# Patient Record
Sex: Female | Born: 1960 | Race: White | Hispanic: No | Marital: Married | Smoking: Former smoker
Health system: Southern US, Community
[De-identification: ages and names within clinical notes are randomized; demographics above are authoritative.]

## PROBLEM LIST (undated history)

## (undated) DIAGNOSIS — J45909 Unspecified asthma, uncomplicated: Secondary | ICD-10-CM

## (undated) DIAGNOSIS — R319 Hematuria, unspecified: Secondary | ICD-10-CM

## (undated) DIAGNOSIS — N816 Rectocele: Secondary | ICD-10-CM

## (undated) DIAGNOSIS — N951 Menopausal and female climacteric states: Secondary | ICD-10-CM

## (undated) DIAGNOSIS — J302 Other seasonal allergic rhinitis: Secondary | ICD-10-CM

## (undated) HISTORY — DX: Hematuria, unspecified: R31.9

## (undated) HISTORY — PX: OTHER SURGICAL HISTORY: SHX169

## (undated) HISTORY — DX: Menopausal and female climacteric states: N95.1

## (undated) HISTORY — DX: Rectocele: N81.6

---

## 2000-09-30 ENCOUNTER — Other Ambulatory Visit: Admission: RE | Admit: 2000-09-30 | Discharge: 2000-09-30 | Payer: Self-pay | Admitting: Obstetrics and Gynecology

## 2005-05-24 ENCOUNTER — Ambulatory Visit (HOSPITAL_COMMUNITY): Admission: RE | Admit: 2005-05-24 | Discharge: 2005-05-24 | Payer: Self-pay | Admitting: Obstetrics and Gynecology

## 2006-06-14 ENCOUNTER — Ambulatory Visit (HOSPITAL_COMMUNITY): Admission: RE | Admit: 2006-06-14 | Discharge: 2006-06-14 | Payer: Self-pay | Admitting: Obstetrics and Gynecology

## 2007-05-01 ENCOUNTER — Other Ambulatory Visit: Admission: RE | Admit: 2007-05-01 | Discharge: 2007-05-01 | Payer: Self-pay | Admitting: Obstetrics and Gynecology

## 2007-08-04 ENCOUNTER — Ambulatory Visit (HOSPITAL_COMMUNITY): Admission: RE | Admit: 2007-08-04 | Discharge: 2007-08-04 | Payer: Self-pay | Admitting: Obstetrics and Gynecology

## 2007-09-18 ENCOUNTER — Ambulatory Visit (HOSPITAL_COMMUNITY): Admission: RE | Admit: 2007-09-18 | Discharge: 2007-09-18 | Payer: Self-pay | Admitting: Obstetrics and Gynecology

## 2008-07-09 ENCOUNTER — Other Ambulatory Visit: Admission: RE | Admit: 2008-07-09 | Discharge: 2008-07-09 | Payer: Self-pay | Admitting: Obstetrics and Gynecology

## 2008-10-22 ENCOUNTER — Ambulatory Visit (HOSPITAL_COMMUNITY): Admission: RE | Admit: 2008-10-22 | Discharge: 2008-10-22 | Payer: Self-pay | Admitting: Obstetrics and Gynecology

## 2009-08-06 ENCOUNTER — Other Ambulatory Visit: Admission: RE | Admit: 2009-08-06 | Discharge: 2009-08-06 | Payer: Self-pay | Admitting: Obstetrics and Gynecology

## 2009-10-24 ENCOUNTER — Ambulatory Visit (HOSPITAL_COMMUNITY): Admission: RE | Admit: 2009-10-24 | Discharge: 2009-10-24 | Payer: Self-pay | Admitting: Obstetrics and Gynecology

## 2010-04-12 ENCOUNTER — Encounter: Payer: Self-pay | Admitting: Obstetrics and Gynecology

## 2010-11-06 ENCOUNTER — Other Ambulatory Visit: Payer: Self-pay | Admitting: Obstetrics and Gynecology

## 2010-11-06 DIAGNOSIS — N6310 Unspecified lump in the right breast, unspecified quadrant: Secondary | ICD-10-CM

## 2010-11-11 ENCOUNTER — Other Ambulatory Visit: Payer: Self-pay | Admitting: Obstetrics and Gynecology

## 2010-11-11 ENCOUNTER — Ambulatory Visit (HOSPITAL_COMMUNITY)
Admission: RE | Admit: 2010-11-11 | Discharge: 2010-11-11 | Disposition: A | Discharge status: Home / Self Care | Payer: BC Managed Care – PPO | Source: Ambulatory Visit | Attending: Obstetrics and Gynecology | Admitting: Obstetrics and Gynecology

## 2010-11-11 DIAGNOSIS — N6310 Unspecified lump in the right breast, unspecified quadrant: Secondary | ICD-10-CM

## 2010-11-11 DIAGNOSIS — N6009 Solitary cyst of unspecified breast: Secondary | ICD-10-CM | POA: Insufficient documentation

## 2011-09-27 ENCOUNTER — Other Ambulatory Visit (HOSPITAL_COMMUNITY)
Admission: RE | Admit: 2011-09-27 | Discharge: 2011-09-27 | Disposition: A | Discharge status: Home / Self Care | Payer: BC Managed Care – PPO | Source: Ambulatory Visit | Attending: Obstetrics and Gynecology | Admitting: Obstetrics and Gynecology

## 2011-09-27 DIAGNOSIS — Z1159 Encounter for screening for other viral diseases: Secondary | ICD-10-CM | POA: Insufficient documentation

## 2011-09-27 DIAGNOSIS — Z01419 Encounter for gynecological examination (general) (routine) without abnormal findings: Secondary | ICD-10-CM | POA: Insufficient documentation

## 2011-09-28 ENCOUNTER — Telehealth: Payer: Self-pay

## 2011-09-28 ENCOUNTER — Other Ambulatory Visit: Payer: Self-pay

## 2011-09-28 DIAGNOSIS — Z139 Encounter for screening, unspecified: Secondary | ICD-10-CM

## 2011-09-28 NOTE — Telephone Encounter (Signed)
MOVI PREP SPLIT DOSING, REGULAR BREAKFAST. CLEAR LIQUIDS AFTER 9 AM.  

## 2011-09-28 NOTE — Telephone Encounter (Signed)
Gastroenterology Pre-Procedure Form    Request Date: 09/28/2011         Requesting Physician: Cyril Mourning at [facility]     PATIENT INFORMATION:  Gabrielle Ruiz is a 51 y.o., female (DOB=01/03/1961).  PROCEDURE: Procedure(s) requested: colonoscopy Procedure Reason: screening for colon cancer  PATIENT REVIEW QUESTIONS: The patient reports the following:   1. Diabetes Melitis: no 2. Joint replacements in the past 12 months: no 3. Major health problems in the past 3 months: no 4. Has an artificial valve or MVP:no 5. Has been advised in past to take antibiotics in advance of a procedure like teeth cleaning: no}    MEDICATIONS & ALLERGIES:    Patient reports the following regarding taking any blood thinners:   Plavix? no Aspirin?no Coumadin?  no  Patient confirms/reports the following medications:  Current Outpatient Prescriptions  Medication Sig Dispense Refill  . fish oil-omega-3 fatty acids 1000 MG capsule Take 1 g by mouth daily.      . fluticasone (FLONASE) 50 MCG/ACT nasal spray Place 2 sprays into the nose daily.      Marland Kitchen glucosamine-chondroitin 500-400 MG tablet Take 1 tablet by mouth once.      . loratadine (CLARITIN) 10 MG tablet Take 10 mg by mouth daily.      . Multiple Vitamin (MULTIVITAMIN) tablet Take 1 tablet by mouth daily.      . NON FORMULARY Calcium 630 mg plus Vitamin D 500 mg    One tablet daily        Patient confirms/reports the following allergies:  Allergies  Allergen Reactions  . Erythromycin Rash  . Penicillins Rash    Patient is appropriate to schedule for requested procedure(s): yes  AUTHORIZATION INFORMATION Primary Insurance:   ID #:   Group #:  Pre-Cert / Auth required Pre-Cert / Auth #:   Secondary Insurance:   ID #:   Group #:  Pre-Cert / Auth required:  Pre-Cert / Auth #:   No orders of the defined types were placed in this encounter.    SCHEDULE INFORMATION: Procedure has been scheduled as follows:  Date: 10/19/2011       Time: 10:15 AM  Location: [facility] Short Stay  This Gastroenterology Pre-Precedure Form is being routed to the following provider(s) for review: Jonette Eva, MD

## 2011-09-29 NOTE — Telephone Encounter (Signed)
Rx and instructions mailed to pt.  

## 2011-10-04 ENCOUNTER — Encounter (HOSPITAL_COMMUNITY): Payer: Self-pay | Admitting: Pharmacy Technician

## 2011-10-15 ENCOUNTER — Telehealth: Payer: Self-pay

## 2011-10-15 NOTE — Telephone Encounter (Signed)
Pt's husband called and wanted a cheaper prep for pt. I faxed over [facility] and new instructions for pt to [facility].

## 2011-10-19 ENCOUNTER — Ambulatory Visit (HOSPITAL_COMMUNITY)
Admission: RE | Admit: 2011-10-19 | Discharge: 2011-10-19 | Disposition: A | Discharge status: Home / Self Care | Payer: BC Managed Care – PPO | Source: Ambulatory Visit | Attending: Gastroenterology | Admitting: Gastroenterology

## 2011-10-19 ENCOUNTER — Encounter (HOSPITAL_COMMUNITY): Payer: Self-pay | Admitting: *Deleted

## 2011-10-19 ENCOUNTER — Encounter (HOSPITAL_COMMUNITY)
Admission: RE | Disposition: A | Discharge status: Home / Self Care | Payer: Self-pay | Source: Ambulatory Visit | Attending: Gastroenterology

## 2011-10-19 DIAGNOSIS — Z139 Encounter for screening, unspecified: Secondary | ICD-10-CM

## 2011-10-19 DIAGNOSIS — D126 Benign neoplasm of colon, unspecified: Secondary | ICD-10-CM | POA: Insufficient documentation

## 2011-10-19 DIAGNOSIS — Z1211 Encounter for screening for malignant neoplasm of colon: Secondary | ICD-10-CM | POA: Insufficient documentation

## 2011-10-19 DIAGNOSIS — K648 Other hemorrhoids: Secondary | ICD-10-CM

## 2011-10-19 HISTORY — PX: COLONOSCOPY: SHX5424

## 2011-10-19 HISTORY — DX: Other seasonal allergic rhinitis: J30.2

## 2011-10-19 HISTORY — DX: Unspecified asthma, uncomplicated: J45.909

## 2011-10-19 SURGERY — COLONOSCOPY
Anesthesia: Moderate Sedation

## 2011-10-19 MED ORDER — MEPERIDINE HCL 100 MG/ML IJ SOLN
INTRAMUSCULAR | Status: AC
  Filled 2011-10-19: qty 1

## 2011-10-19 MED ORDER — MIDAZOLAM HCL 5 MG/5ML IJ SOLN
INTRAMUSCULAR | Status: AC
  Filled 2011-10-19: qty 10

## 2011-10-19 MED ORDER — MIDAZOLAM HCL 5 MG/5ML IJ SOLN
INTRAMUSCULAR | Status: DC | PRN
  Administered 2011-10-19: 2 mg via INTRAVENOUS
  Administered 2011-10-19: 1 mg via INTRAVENOUS

## 2011-10-19 MED ORDER — HYDROCORTISONE ACE-PRAMOXINE 1-1 % RE CREA: TOPICAL_CREAM | RECTAL | Status: AC

## 2011-10-19 MED ORDER — MEPERIDINE HCL 100 MG/ML IJ SOLN
INTRAMUSCULAR | Status: DC | PRN
  Administered 2011-10-19: 50 mg via INTRAVENOUS
  Administered 2011-10-19: 25 mg via INTRAVENOUS

## 2011-10-19 MED ORDER — SODIUM CHLORIDE 0.45 % IV SOLN
Freq: Once | INTRAVENOUS | Status: AC
  Administered 2011-10-19: 10:00:00 via INTRAVENOUS

## 2011-10-19 MED ORDER — STERILE WATER FOR IRRIGATION IR SOLN
Status: DC | PRN
  Administered 2011-10-19: 10:00:00

## 2011-10-19 NOTE — Op Note (Signed)
[facility] [address] [place], [place]  [address]  COLONOSCOPY PROCEDURE REPORT  PATIENT:  Gabrielle Ruiz, Gabrielle Ruiz  MR#:  [id number] BIRTHDATE:  12-Mar-1961, 50 yrs. old  GENDER:  female  ENDOSCOPIST:  Jonette Eva, MD REF. BY:  Artis Delay, M.D. ASSISTANT:  PROCEDURE DATE:  10/19/2011 PROCEDURE:  Colonoscopy with biopsy  INDICATIONS:  Screening  MEDICATIONS:   Demerol 75 mg IV, Versed 3 mg IV  DESCRIPTION OF PROCEDURE:    Physical exam was performed. Informed consent was obtained from the patient after explaining the benefits, risks, and alternatives to procedure.  The patient was connected to monitor and placed in left lateral position. Continuous oxygen was provided by nasal cannula and IV medicine administered through an indwelling cannula.  After administration of sedation and rectal exam, the patient's rectum was intubated and the EC-[id number] ([id number]) colonoscope was advanced under direct visualization to the cecum.  The scope was removed slowly by carefully examining the color, texture, anatomy, and integrity mucosa on the way out.  The patient was recovered in endoscopy and discharged home in satisfactory condition. <<PROCEDUREIMAGES>>  FINDINGS:  There WAS ONE 3 MM SESSILE polyp identified and removed. in the proximal transverse colon VIA COLD FORCEPS. NO DIVERTICULA.  SMALL Internal Hemorrhoids were found.  PREP QUALITY: EXCELLENT CECAL W/D TIME:    7 minutes  COMPLICATIONS:    None  ENDOSCOPIC IMPRESSION: 1) Polyp in the proximal transverse colon 2) Internal hemorrhoids  RECOMMENDATIONS: 1) Await biopsy results HIGH FIBER DIET PROCOCREAM 2-4 TIMES A DAY FOR 10 DAYS TO RELIEVE HEMORRHOID BLEEDING/PAIN/PRESSURE TCS IN 10 YEARS WITH 1/2 MOVIPREP OR PREPOPIK  REPEAT EXAM:  No  ______________________________ Jonette Eva, MD  CC:  Artis Delay, M.D.  n. eSIGNED:   Myan Suit at 10/19/2011 10:31 AM  Glendell Docker, [phone]

## 2011-10-19 NOTE — H&P (Signed)
  Primary Care Physician:  Cassell Smiles., MD Primary Gastroenterologist:  Dr. Darrick Penna  Pre-Procedure History & Physical: HPI:  Gabrielle Ruiz is a 51 y.o. female here for COLON CANCER SCREENING.   Past Medical History  Diagnosis Date  . Seasonal allergies   . Asthma     Exercise and Allergy induced    Past Surgical History  Procedure Date  . Hymen ring surgically removed     Prior to Admission medications   Medication Sig Start Date End Date Taking? Authorizing Provider  aspirin 81 MG tablet Take 81 mg by mouth daily.   Yes Historical Provider, MD  Calcium Carbonate-Vit D-Min (CALCIUM 600+D PLUS MINERALS) 600-400 MG-UNIT TABS Take 1 tablet by mouth daily.   Yes Historical Provider, MD  fish oil-omega-3 fatty acids 1000 MG capsule Take 1 g by mouth daily.   Yes Historical Provider, MD  fluticasone (FLONASE) 50 MCG/ACT nasal spray Place 2 sprays into the nose daily.   Yes Historical Provider, MD  glucosamine-chondroitin 500-400 MG tablet Take 1 tablet by mouth daily.    Yes Historical Provider, MD  loratadine (CLARITIN) 10 MG tablet Take 10 mg by mouth daily.   Yes Historical Provider, MD  Multiple Vitamin (MULTIVITAMIN WITH MINERALS) TABS Take 1 tablet by mouth daily.   Yes Historical Provider, MD  psyllium (METAMUCIL) 58.6 % powder Take 1 packet by mouth daily.   Yes Historical Provider, MD  acetaminophen (TYLENOL) 500 MG tablet Take 500 mg by mouth every 6 (six) hours as needed. For pain    Historical Provider, MD  albuterol (PROVENTIL HFA;VENTOLIN HFA) 108 (90 BASE) MCG/ACT inhaler Inhale 2 puffs into the lungs every 6 (six) hours as needed. For shortness of breath    Historical Provider, MD    Allergies as of 09/28/2011 - never reviewed  Allergen Reaction Noted  . Erythromycin Rash 09/28/2011  . Penicillins Rash 09/28/2011    Family History  Problem Relation Age of Onset  . Colon cancer Neg Hx     History   Social History  . Marital Status: Married   Spouse Name: N/A    Number of Children: N/A  . Years of Education: N/A   Occupational History  . Not on file.   Social History Main Topics  . Smoking status: Former Smoker -- 0.5 packs/day for 15 years    Types: Cigarettes  . Smokeless tobacco: Not on file  . Alcohol Use: 0.6 oz/week    1 Glasses of wine per week  . Drug Use: No  . Sexually Active:    Other Topics Concern  . Not on file   Social History Narrative  . No narrative on file    Review of Systems: See HPI, otherwise negative ROS   Physical Exam: BP 136/81  Pulse 75  Temp 98.1 F (36.7 C) (Oral)  Resp 16  Ht 5\' 2"  (1.575 m)  Wt 140 lb (63.504 kg)  BMI 25.61 kg/m2  SpO2 98%  LMP 10/19/2011 General:   Alert,  pleasant and cooperative in NAD Head:  Normocephalic and atraumatic. Neck:  Supple;  Lungs:  Clear throughout to auscultation.    Heart:  Regular rate and rhythm. Abdomen:  Soft, nontender and nondistended. Normal bowel sounds, without guarding, and without rebound.   Neurologic:  Alert and  oriented x4;  grossly normal neurologically.  Impression/Plan:     SCREENING  Plan:  1. TCS TODAY

## 2011-10-21 ENCOUNTER — Encounter (HOSPITAL_COMMUNITY): Payer: Self-pay | Admitting: Gastroenterology

## 2011-10-22 ENCOUNTER — Telehealth: Payer: Self-pay | Admitting: Gastroenterology

## 2011-10-22 NOTE — Telephone Encounter (Signed)
Please call pt. She had a polypoid lesion, removed and it was benign. High fiber diet. TCS in 10 years.

## 2011-10-22 NOTE — Telephone Encounter (Signed)
Pt aware of results with no questions

## 2011-10-22 NOTE — Telephone Encounter (Signed)
Recall made 

## 2011-10-25 ENCOUNTER — Telehealth: Payer: Self-pay

## 2011-10-25 NOTE — Telephone Encounter (Signed)
Pt called. Said she has not had a BM since her colonoscopy on 10/19/2011. Said she is drinking lots of water, eating high fiber foods, and she would like to know what Dr. Darrick Penna recommends for her. Please advise!

## 2011-10-25 NOTE — Telephone Encounter (Signed)
Sent pager message to Dr. Darrick Penna at 2:50 PM. Routing to Gerrit Halls, NP for advice.

## 2011-10-25 NOTE — Telephone Encounter (Signed)
PLEASE CALL PT. SHE SHOULD HAVE A BM ANY DAY NOW. SHE HAD AN EXCELLENT PREP AND IT WILL TAKE A FEW DAYS TO GET STOOL TO REACH HER RECTUM FOR A BM. CONTINUE HIGH FIBER DIET. DRINK WATER.

## 2011-10-25 NOTE — Telephone Encounter (Signed)
LMOM to call.

## 2011-10-26 NOTE — Telephone Encounter (Signed)
Called and informed pt.  

## 2012-03-02 ENCOUNTER — Other Ambulatory Visit: Payer: Self-pay | Admitting: Obstetrics and Gynecology

## 2012-03-02 DIAGNOSIS — Z139 Encounter for screening, unspecified: Secondary | ICD-10-CM

## 2012-03-13 ENCOUNTER — Ambulatory Visit (HOSPITAL_COMMUNITY)
Admission: RE | Admit: 2012-03-13 | Discharge: 2012-03-13 | Disposition: A | Discharge status: Home / Self Care | Payer: BC Managed Care – PPO | Source: Ambulatory Visit | Attending: Obstetrics and Gynecology | Admitting: Obstetrics and Gynecology

## 2012-03-13 DIAGNOSIS — N63 Unspecified lump in unspecified breast: Secondary | ICD-10-CM | POA: Insufficient documentation

## 2012-03-13 DIAGNOSIS — Z139 Encounter for screening, unspecified: Secondary | ICD-10-CM

## 2012-03-13 DIAGNOSIS — Z1231 Encounter for screening mammogram for malignant neoplasm of breast: Secondary | ICD-10-CM | POA: Insufficient documentation

## 2012-03-21 ENCOUNTER — Other Ambulatory Visit: Payer: Self-pay | Admitting: Obstetrics and Gynecology

## 2012-03-21 DIAGNOSIS — R928 Other abnormal and inconclusive findings on diagnostic imaging of breast: Secondary | ICD-10-CM

## 2012-04-20 ENCOUNTER — Ambulatory Visit
Admission: RE | Admit: 2012-04-20 | Discharge: 2012-04-20 | Disposition: A | Discharge status: Home / Self Care | Payer: BC Managed Care – PPO | Source: Ambulatory Visit | Attending: Obstetrics and Gynecology | Admitting: Obstetrics and Gynecology

## 2012-04-20 DIAGNOSIS — R928 Other abnormal and inconclusive findings on diagnostic imaging of breast: Secondary | ICD-10-CM

## 2013-07-08 IMAGING — MG MM DIGITAL SCREENING BILAT
4 series · 4 of 4 positions shown · non-contrast
Comparison: Previous exams.

CLINICAL DATA: Screening.

DIGITAL BILATERAL SCREENING MAMMOGRAM WITH CAD

[L CC]
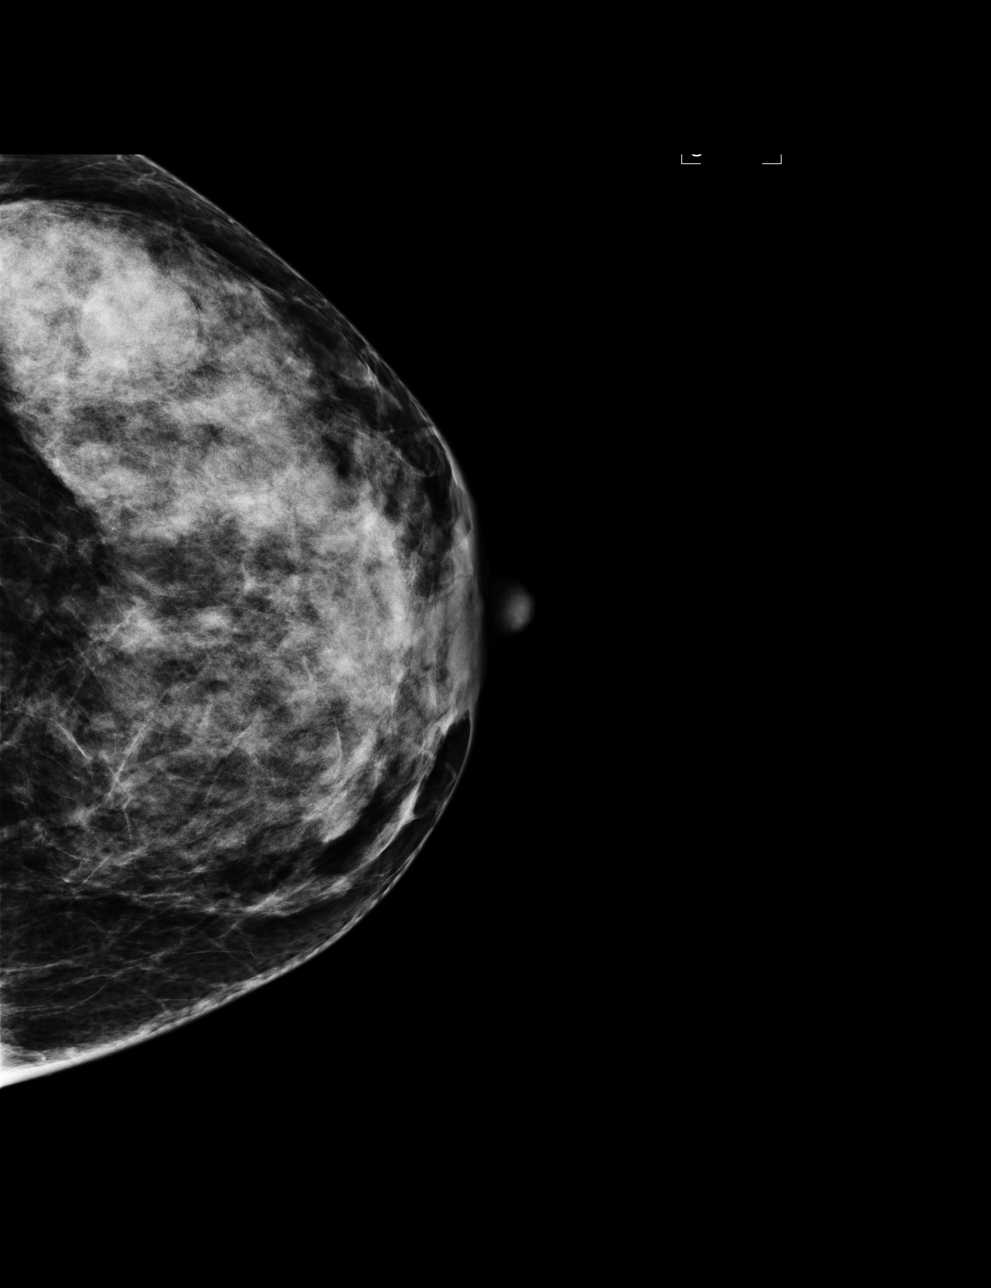

[L MLO]
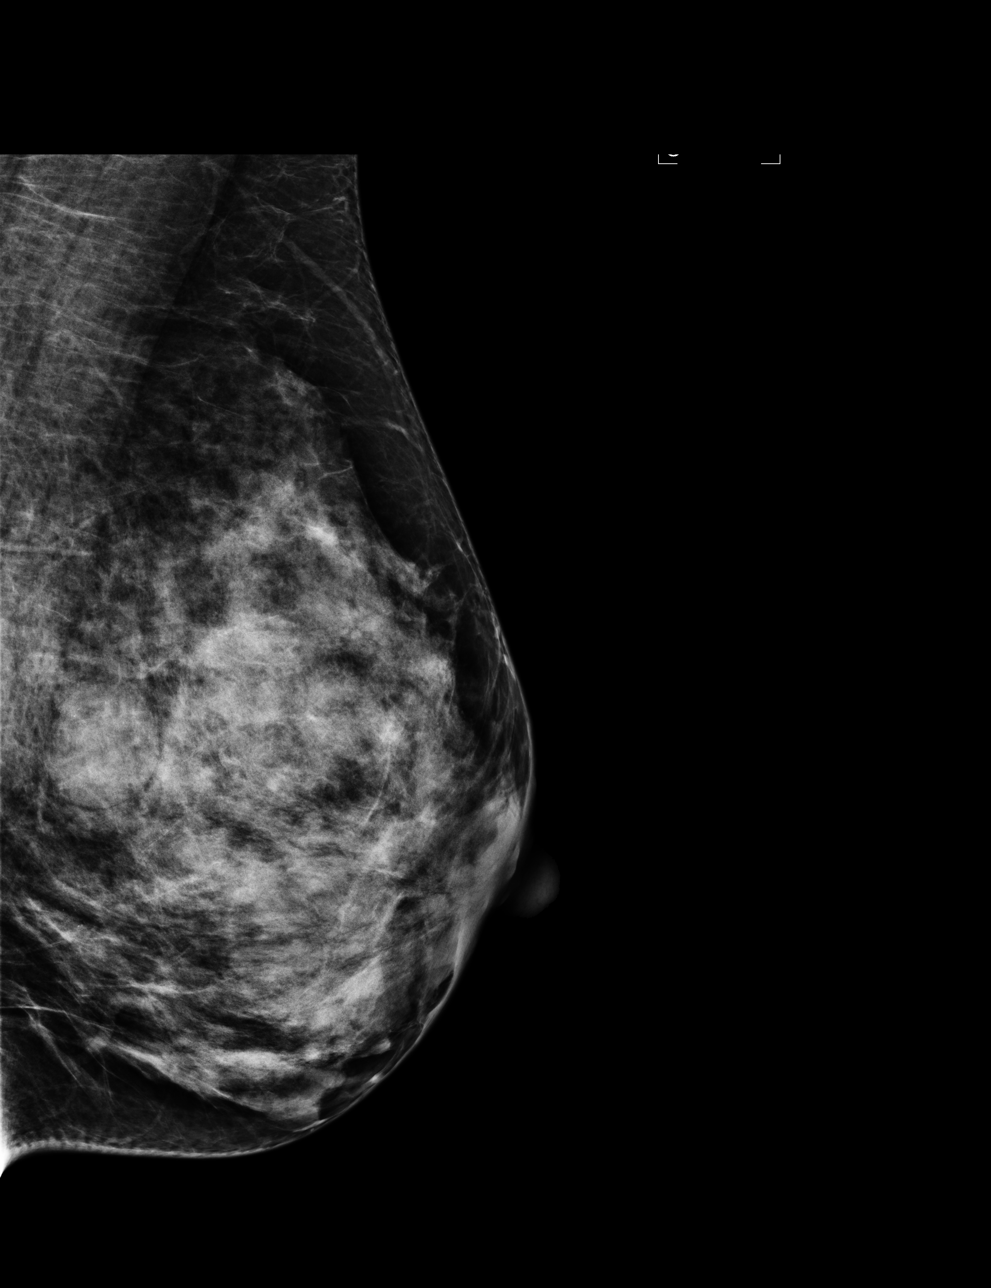

[R CC]
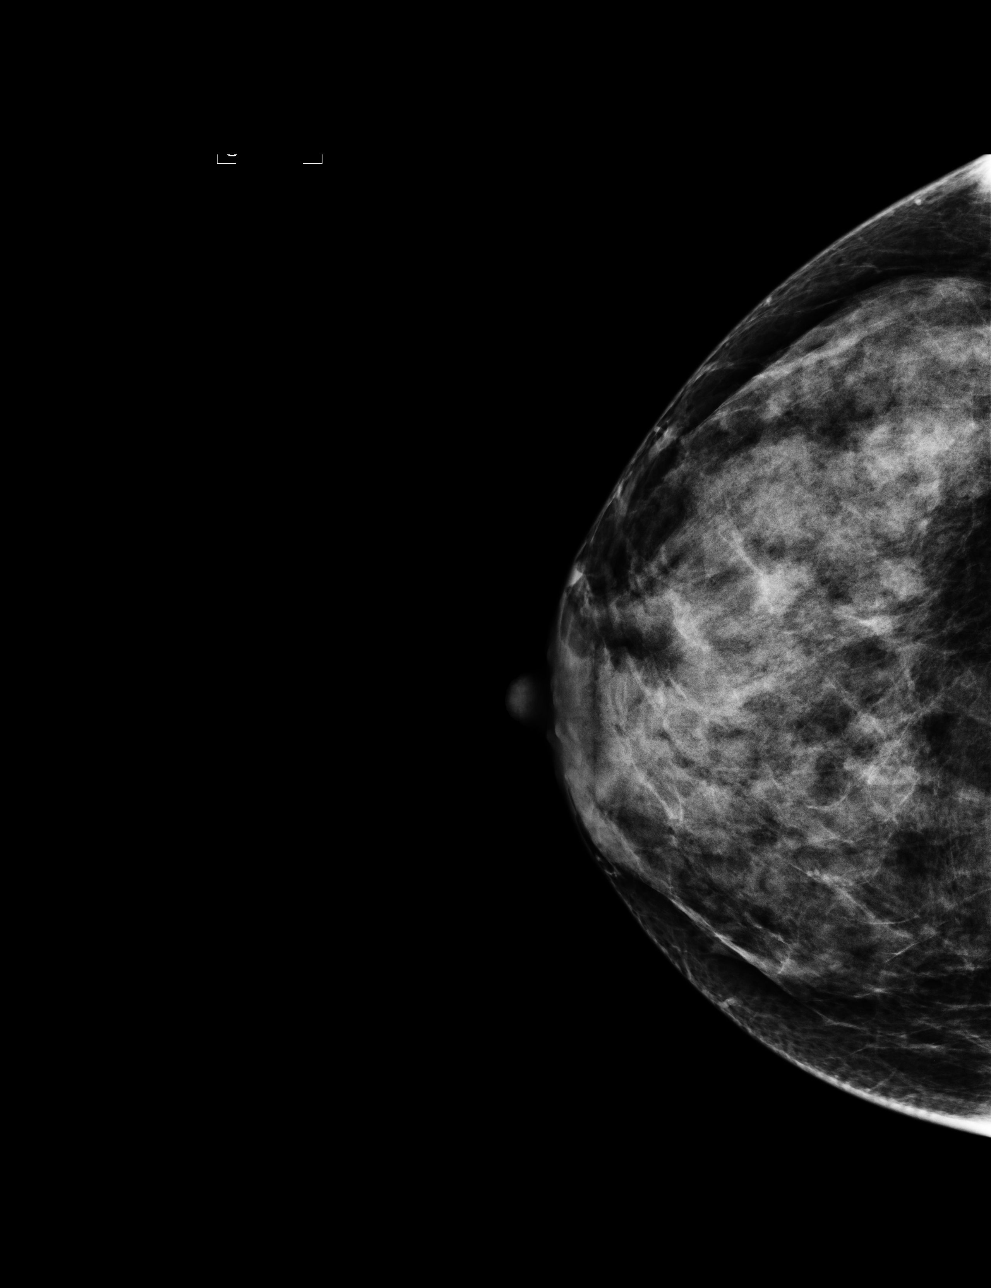

[R MLO]
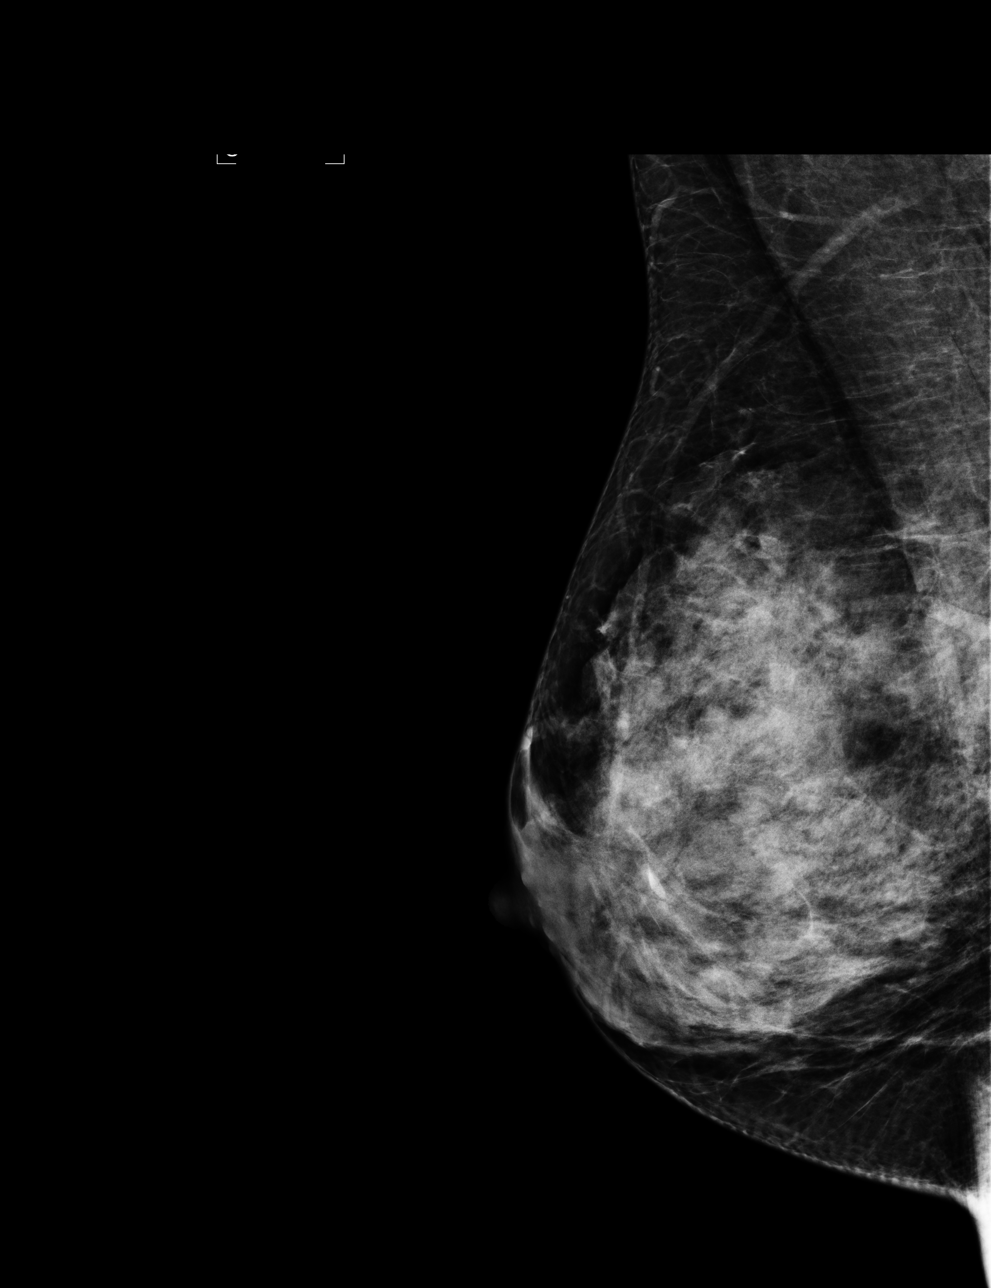

[4 of 4 positions shown; findings below may reference images not displayed]

FINDINGS: ACR Breast Density Category 4: The breast tissue is extremely
dense.

In the left breast, a possible mass warrants further evaluation
with spot compression views and possibly ultrasound.  In the right
breast, no masses or malignant type calcifications are identified.

Images were processed with CAD.
IMPRESSION: Further evaluation is suggested for possible mass in the left
breast.

RECOMMENDATION:
Diagnostic mammogram and possibly ultrasound of the left breast.
(Code:61-9-CCI)

BI-RADS CATEGORY 0:  Incomplete.  Need additional imaging
evaluation and/or prior mammograms for comparison.

## 2013-10-16 ENCOUNTER — Ambulatory Visit (INDEPENDENT_AMBULATORY_CARE_PROVIDER_SITE_OTHER): Payer: BC Managed Care – PPO | Admitting: Adult Health

## 2013-10-16 ENCOUNTER — Encounter: Payer: Self-pay | Admitting: Adult Health

## 2013-10-16 VITALS — BP 102/60 | HR 70 | Ht 62.0 in | Wt 146.5 lb

## 2013-10-16 DIAGNOSIS — Z01419 Encounter for gynecological examination (general) (routine) without abnormal findings: Secondary | ICD-10-CM

## 2013-10-16 DIAGNOSIS — N951 Menopausal and female climacteric states: Secondary | ICD-10-CM

## 2013-10-16 DIAGNOSIS — Z1212 Encounter for screening for malignant neoplasm of rectum: Secondary | ICD-10-CM

## 2013-10-16 HISTORY — DX: Menopausal and female climacteric states: N95.1

## 2013-10-16 LAB — COMPREHENSIVE METABOLIC PANEL
ALBUMIN: 3.9 g/dL (ref 3.5–5.2)
ALT: 12 U/L (ref 0–35)
AST: 18 U/L (ref 0–37)
Alkaline Phosphatase: 56 U/L (ref 39–117)
BUN: 10 mg/dL (ref 6–23)
CALCIUM: 8.7 mg/dL (ref 8.4–10.5)
CHLORIDE: 101 meq/L (ref 96–112)
CO2: 30 mEq/L (ref 19–32)
Creat: 0.69 mg/dL (ref 0.50–1.10)
Glucose, Bld: 77 mg/dL (ref 70–99)
POTASSIUM: 3.8 meq/L (ref 3.5–5.3)
Sodium: 138 mEq/L (ref 135–145)
Total Bilirubin: 0.6 mg/dL (ref 0.2–1.2)
Total Protein: 6.5 g/dL (ref 6.0–8.3)

## 2013-10-16 LAB — LIPID PANEL
Cholesterol: 200 mg/dL (ref 0–200)
HDL: 80 mg/dL (ref >39)
LDL CALC: 106 mg/dL — AB (ref 0–99)
TRIGLYCERIDES: 71 mg/dL (ref <150)
Total CHOL/HDL Ratio: 2.5 Ratio
VLDL: 14 mg/dL (ref 0–40)

## 2013-10-16 LAB — CBC
HEMATOCRIT: 37.6 % (ref 36.0–46.0)
Hemoglobin: 13 g/dL (ref 12.0–15.0)
MCH: 29.3 pg (ref 26.0–34.0)
MCHC: 34.6 g/dL (ref 30.0–36.0)
MCV: 84.9 fL (ref 78.0–100.0)
Platelets: 229 10*3/uL (ref 150–400)
RBC: 4.43 MIL/uL (ref 3.87–5.11)
RDW: 13.9 % (ref 11.5–15.5)
WBC: 5.7 10*3/uL (ref 4.0–10.5)

## 2013-10-16 LAB — HEMOCCULT GUIAC POC 1CARD (OFFICE): Fecal Occult Blood, POC: NEGATIVE

## 2013-10-16 LAB — TSH: TSH: 3.891 u[IU]/mL (ref 0.350–4.500)

## 2013-10-16 NOTE — Patient Instructions (Signed)
Physical in  1 year Mammogram yearly  colonoscopy at 29 Labs today

## 2013-10-16 NOTE — Progress Notes (Signed)
Patient ID: Gabrielle Ruiz, female   DOB: Apr 29, 1960, 53 y.o.   MRN: [id number] History of Present Illness: Gabrielle Ruiz is a 53 year old white female, married in for a physical.Last pap 09/26/13 was normal with negative HPV.Her periods are just spotting now and are irregular.But has not been 366 days yet.Has had some hot flashes and sleep disturbance but all that is better.   Current Medications, Allergies, Past Medical History, Past Surgical History, Family History and Social History were reviewed in [employer] record.     Review of Systems: Patient denies any headaches, blurred vision, shortness of breath, chest pain, abdominal pain, problems with bowel movements, urination, or intercourse. No joint pain or mood swings.Has some tingling in finger tips when runs or mows the grass.Sometimes when running feels tingling in chest.She had colonoscopy at 32.She says her finger tips and toes will turn white and tingle, her dad has Raynaud's and she thinks she does too.    Physical Exam:BP 102/60  Pulse 70  Ht 5\' 2"  (1.575 m)  Wt 146 lb 8 oz (66.452 kg)  BMI 26.79 kg/m2  LMP 10/09/2013 General:  Well developed, well nourished, no acute distress Skin:  Warm and dry Neck:  Midline trachea, normal thyroid Lungs; Clear to auscultation bilaterally Breast:  No dominant palpable mass, retraction, or nipple discharge Cardiovascular: Regular rate and rhythm Abdomen:  Soft, non tender, no hepatosplenomegaly Pelvic:  External genitalia is normal in appearance.  The vagina is normal in appearance.  The cervix is bulbous.  Uterus is felt to be normal size, shape, and contour.  No adnexal masses or tenderness noted. Rectal: Good sphincter tone, no polyps, or hemorrhoids felt.  Hemoccult negative. Extremities:  No swelling or varicosities noted, has good color and temperature in fingers with noraml blanching. Psych:  No mood changes, alert and cooperative,seems happy She asks about  getting stress test, will get labs first, then get cardio consult if desired.discussed menopause symptoms.  Impression: Yearly gyn exam Menopausal     Plan: Check CBC,CMP,TSH and lipids Physical and pap in 1 year Mammogram yearly Colonoscopy at 53

## 2013-10-17 ENCOUNTER — Telehealth: Payer: Self-pay | Admitting: Adult Health

## 2013-10-17 NOTE — Telephone Encounter (Signed)
Pt aware of labs mail copy to pt

## 2014-01-21 ENCOUNTER — Encounter: Payer: Self-pay | Admitting: Adult Health

## 2014-09-11 ENCOUNTER — Other Ambulatory Visit: Payer: Self-pay

## 2014-09-11 DIAGNOSIS — Z1231 Encounter for screening mammogram for malignant neoplasm of breast: Secondary | ICD-10-CM

## 2014-10-17 ENCOUNTER — Ambulatory Visit
Admission: RE | Admit: 2014-10-17 | Discharge: 2014-10-17 | Disposition: A | Discharge status: Home / Self Care | Payer: BC Managed Care – PPO | Source: Ambulatory Visit

## 2014-10-17 DIAGNOSIS — Z1231 Encounter for screening mammogram for malignant neoplasm of breast: Secondary | ICD-10-CM

## 2014-10-22 ENCOUNTER — Ambulatory Visit (INDEPENDENT_AMBULATORY_CARE_PROVIDER_SITE_OTHER): Payer: BC Managed Care – PPO | Admitting: Adult Health

## 2014-10-22 ENCOUNTER — Encounter: Payer: Self-pay | Admitting: Adult Health

## 2014-10-22 ENCOUNTER — Other Ambulatory Visit (HOSPITAL_COMMUNITY)
Admission: RE | Admit: 2014-10-22 | Discharge: 2014-10-22 | Disposition: A | Discharge status: Home / Self Care | Payer: BC Managed Care – PPO | Source: Ambulatory Visit | Attending: Adult Health | Admitting: Adult Health

## 2014-10-22 ENCOUNTER — Other Ambulatory Visit: Payer: Self-pay | Admitting: Obstetrics and Gynecology

## 2014-10-22 VITALS — BP 110/72 | HR 72 | Ht 61.5 in | Wt 145.5 lb

## 2014-10-22 DIAGNOSIS — Z01419 Encounter for gynecological examination (general) (routine) without abnormal findings: Secondary | ICD-10-CM | POA: Diagnosis present

## 2014-10-22 DIAGNOSIS — R319 Hematuria, unspecified: Secondary | ICD-10-CM

## 2014-10-22 DIAGNOSIS — Z1389 Encounter for screening for other disorder: Secondary | ICD-10-CM | POA: Diagnosis not present

## 2014-10-22 DIAGNOSIS — Z1151 Encounter for screening for human papillomavirus (HPV): Secondary | ICD-10-CM | POA: Diagnosis present

## 2014-10-22 DIAGNOSIS — R928 Other abnormal and inconclusive findings on diagnostic imaging of breast: Secondary | ICD-10-CM

## 2014-10-22 DIAGNOSIS — Z1212 Encounter for screening for malignant neoplasm of rectum: Secondary | ICD-10-CM | POA: Diagnosis not present

## 2014-10-22 HISTORY — DX: Hematuria, unspecified: R31.9

## 2014-10-22 LAB — HEMOCCULT GUIAC POC 1CARD (OFFICE): FECAL OCCULT BLD: NEGATIVE

## 2014-10-22 LAB — POCT URINALYSIS DIPSTICK
Glucose, UA: NEGATIVE
LEUKOCYTES UA: NEGATIVE
NITRITE UA: NEGATIVE
Protein, UA: NEGATIVE

## 2014-10-22 NOTE — Progress Notes (Signed)
Patient ID: Gabrielle Ruiz, female   DOB: May 27, 1960, 54 y.o.   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 PCP is [facility].  Current Medications, Allergies, Past Medical History, Past Surgical History, Family History and Social History were reviewed in [employer] record.     Review of Systems: Patient denies any headaches, hearing loss, fatigue, blurred vision, shortness of breath, chest pain, abdominal pain, problems with bowel movements, urination, or intercourse. No joint pain or mood swings.See HPI for positives.    Physical Exam:BP 110/72 mmHg  Pulse 72  Ht 5' 1.5" (1.562 m)  Wt 145 lb 8 oz (65.998 kg)  BMI 27.05 kg/m2  LMP 10/09/2013 Urine trace blood. General:  Well developed, well nourished, no acute distress Skin:  Warm and dry Neck:  Midline trachea, normal thyroid, good ROM, no lymphadenopathy Lungs; Clear to auscultation bilaterally Breast:  Deferred, had mammogram last week, had 3D Cardiovascular: Regular rate and rhythm Abdomen:  Soft, non tender, no hepatosplenomegaly Pelvic:  External genitalia is normal in appearance, no lesions.  The vagina is normal in appearance. Urethra has small red protrusion, it is non tender and is not friable. The cervix is bulbous.Pap with HPV performed.  Uterus is felt to be normal size, shape, and contour.  No adnexal masses or tenderness noted.Bladder is non tender, no masses felt. Rectal: Good sphincter tone, no polyps, or hemorrhoids felt.  Hemoccult negative. Extremities/musculoskeletal:  No swelling or varicosities noted, no clubbing or cyanosis Psych:  No mood changes, alert and cooperative,seems happy   Impression: Well woman gyn exam with  pap Hematuria     Plan: UA C&S Physical in 1 year, pap in 3 if pap normal with negative HPV Mammogram yearly  Colonoscopy at 60 Labs were normal last year

## 2014-10-22 NOTE — Patient Instructions (Signed)
Physical in 1 year Pap in 3 years if normal  Mammogram yearly Push fluids Hematuria Hematuria is blood in your urine. It can be caused by a bladder infection, kidney infection, prostate infection, kidney stone, or cancer of your urinary tract. Infections can usually be treated with medicine, and a kidney stone usually will pass through your urine. If neither of these is the cause of your hematuria, further workup to find out the reason may be needed. It is very important that you tell your health care provider about any blood you see in your urine, even if the blood stops without treatment or happens without causing pain. Blood in your urine that happens and then stops and then happens again can be a symptom of a very serious condition. Also, pain is not a symptom in the initial stages of many urinary cancers. HOME CARE INSTRUCTIONS   Drink lots of fluid, 3-4 quarts a day. If you have been diagnosed with an infection, cranberry juice is especially recommended, in addition to large amounts of water.  Avoid caffeine, tea, and carbonated beverages because they tend to irritate the bladder.  Avoid alcohol because it may irritate the prostate.  Take all medicines as directed by your health care provider.  If you were prescribed an antibiotic medicine, finish it all even if you start to feel better.  If you have been diagnosed with a kidney stone, follow your health care provider's instructions regarding straining your urine to catch the stone.  Empty your bladder often. Avoid holding urine for long periods of time.  After a bowel movement, women should cleanse front to back. Use each tissue only once.  Empty your bladder before and after sexual intercourse if you are a female. SEEK MEDICAL CARE IF:  You develop back pain.  You have a fever.  You have a feeling of sickness in your stomach (nausea) or vomiting.  Your symptoms are not better in 3 days. Return sooner if you are getting  worse. SEEK IMMEDIATE MEDICAL CARE IF:   You develop severe vomiting and are unable to keep the medicine down.  You develop severe back or abdominal pain despite taking your medicines.  You begin passing a large amount of blood or clots in your urine.  You feel extremely weak or faint, or you pass out. MAKE SURE YOU:   Understand these instructions.  Will watch your condition.  Will get help right away if you are not doing well or get worse. Document Released: 03/08/2005 Document Revised: 07/23/2013 Document Reviewed: 11/06/2012 [facility] Patient Information 2015 [place], [place]. This information is not intended to replace advice given to you by your health care provider. Make sure you discuss any questions you have with your health care provider.

## 2014-10-23 ENCOUNTER — Telehealth: Payer: Self-pay | Admitting: Adult Health

## 2014-10-23 LAB — CYTOLOGY - PAP

## 2014-10-23 LAB — URINALYSIS, ROUTINE W REFLEX MICROSCOPIC
BILIRUBIN UA: NEGATIVE
Glucose, UA: NEGATIVE
KETONES UA: NEGATIVE
NITRITE UA: NEGATIVE
Protein, UA: NEGATIVE
RBC UA: NEGATIVE
SPEC GRAV UA: 1.024 (ref 1.005–1.030)
UUROB: 0.2 mg/dL (ref 0.2–1.0)
pH, UA: 5.5 (ref 5.0–7.5)

## 2014-10-23 LAB — MICROSCOPIC EXAMINATION
CASTS: NONE SEEN /LPF
Epithelial Cells (non renal): NONE SEEN /hpf (ref 0–10)

## 2014-10-23 NOTE — Telephone Encounter (Signed)
Pt aware no blood in urine

## 2014-10-24 LAB — URINE CULTURE: ORGANISM ID, BACTERIA: NO GROWTH

## 2014-10-25 ENCOUNTER — Ambulatory Visit
Admission: RE | Admit: 2014-10-25 | Discharge: 2014-10-25 | Disposition: A | Discharge status: Home / Self Care | Payer: BC Managed Care – PPO | Source: Ambulatory Visit | Attending: Obstetrics and Gynecology | Admitting: Obstetrics and Gynecology

## 2014-10-25 DIAGNOSIS — R928 Other abnormal and inconclusive findings on diagnostic imaging of breast: Secondary | ICD-10-CM

## 2015-11-10 ENCOUNTER — Ambulatory Visit (INDEPENDENT_AMBULATORY_CARE_PROVIDER_SITE_OTHER): Payer: BC Managed Care – PPO | Admitting: Adult Health

## 2015-11-10 ENCOUNTER — Encounter: Payer: Self-pay | Admitting: Adult Health

## 2015-11-10 VITALS — BP 90/62 | HR 64 | Ht 61.5 in | Wt 150.0 lb

## 2015-11-10 DIAGNOSIS — Z1211 Encounter for screening for malignant neoplasm of colon: Secondary | ICD-10-CM

## 2015-11-10 DIAGNOSIS — Z01419 Encounter for gynecological examination (general) (routine) without abnormal findings: Secondary | ICD-10-CM

## 2015-11-10 LAB — HEMOCCULT GUIAC POC 1CARD (OFFICE): FECAL OCCULT BLD: NEGATIVE

## 2015-11-10 NOTE — Progress Notes (Signed)
Patient ID: Gabrielle Ruiz, female   DOB: 05-14-1960, 55 y.o.   MRN: [id number] History of Present Illness: Gabrielle Ruiz is a 55 year old white female, married in for a well woman gyn exam,she had a normal pap with negative HPV 10/22/14.She is complaining of some weight gain, about 4.5 lbs since last year.She has done Whole 30 in past and will do again and is going to do Bootcamp at [place]. PCP is Dr Hilma Favors.   Current Medications, Allergies, Past Medical History, Past Surgical History, Family History and Social History were reviewed in [employer] record.     Review of Systems: Patient denies any headaches, hearing loss, fatigue, blurred vision, shortness of breath, chest pain, abdominal pain, problems with bowel movements, urination, or intercourse. No joint pain or mood swings. +weight gain   Physical Exam:BP 90/62 (BP Location: Left Arm, Patient Position: Sitting, Cuff Size: Normal)   Pulse 64   Ht 5' 1.5" (1.562 m)   Wt 150 lb (68 kg)   LMP 10/09/2013   BMI 27.88 kg/m  General:  Well developed, well nourished, no acute distress Skin:  Warm and dry,has areas where dermatologist burned  Neck:  Midline trachea, normal thyroid, good ROM, no lymphadenopathy Lungs; Clear to auscultation bilaterally Breast:  No dominant palpable mass, retraction, or nipple discharge Cardiovascular: Regular rate and rhythm Abdomen:  Soft, non tender, no hepatosplenomegaly Pelvic:  External genitalia is normal in appearance, no lesions.  The vagina is normal in appearance. Urethra has no lesions or masses. The cervix is bulbous and smooth.  Uterus is felt to be normal size, shape, and contour.  No adnexal masses or tenderness noted.Bladder is non tender, no masses felt. Rectal: Good sphincter tone, no polyps, or hemorrhoids felt.  Hemoccult negative. Extremities/musculoskeletal:  No swelling or varicosities noted, no clubbing or cyanosis,+rectocele  Psych:  No mood changes, alert and  cooperative,seems happy   Impression: Well woman gyn exam no pap    Plan: Physical in 1 year, pap in 2019 Mammogram yearly Colonoscopy per GI Labs with PCP  Get shingles shot and flu shot this Fall

## 2015-11-10 NOTE — Patient Instructions (Addendum)
Physical in 1 year,ppa in 2019 Mammogram yearly Colonoscopy per GI Labs with PCP

## 2015-11-13 ENCOUNTER — Ambulatory Visit (INDEPENDENT_AMBULATORY_CARE_PROVIDER_SITE_OTHER): Payer: BC Managed Care – PPO | Admitting: Podiatry

## 2015-11-13 ENCOUNTER — Encounter: Payer: Self-pay | Admitting: Podiatry

## 2015-11-13 ENCOUNTER — Ambulatory Visit (INDEPENDENT_AMBULATORY_CARE_PROVIDER_SITE_OTHER): Payer: BC Managed Care – PPO

## 2015-11-13 VITALS — BP 118/69 | HR 72 | Resp 12

## 2015-11-13 DIAGNOSIS — M79672 Pain in left foot: Secondary | ICD-10-CM | POA: Diagnosis not present

## 2015-11-13 MED ORDER — METHYLPREDNISOLONE 4 MG PO TBPK: ORAL_TABLET | ORAL | 0 refills | Status: DC

## 2015-11-13 NOTE — Progress Notes (Signed)
   Subjective:    Patient ID: Gabrielle Ruiz, female    DOB: 11-21-1960, 55 y.o.   MRN: [id number]  HPI: She presents today with chief complaint of left foot pain subsecond metatarsophalangeal joint for about a year now. She states this seems to be worse when she is standing and really she's tried no treatment for this. She denies any trauma.    Review of Systems  Musculoskeletal: Positive for gait problem.       Objective:   Physical Exam: Vital signs are stable she is alert and oriented 3 pulses are strongly palpable. She is a Pharmacist, hospital. Neurologic sensorium is intact versus once the monofilament. He can reflexes are intact. Orthopedic evaluation of his resolved joints distal to the ankle range of motion without crepitation. Cutaneous evaluation of Mr. supple well-hydrated cutis there is no erythema edema cellulitis drainage or odor. She has pain on range of motion of the second metatarsophalangeal joint of the left foot. Radiographs taken today do demonstrate an elongated second metatarsal with a mild bunion deformity doesn't. No fractures or medial deviation of the toe.        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  Plan: Discussed etiology pathology conservative versus surgical therapies. At this point she is consented for injection of the second metatarsophalangeal joint periarticular [place]ward. I injected this area today with Kenalog and local anesthetic. Placed her in a Darco shoe. I also provided her with methylprednisolone for the next 6 days and then she will get back into her regular supplement schedule. I will follow-up with her in 1 month at which time we may need to consider orthotics.

## 2015-12-11 ENCOUNTER — Ambulatory Visit (INDEPENDENT_AMBULATORY_CARE_PROVIDER_SITE_OTHER): Payer: BC Managed Care – PPO | Admitting: Podiatry

## 2015-12-11 DIAGNOSIS — M779 Enthesopathy, unspecified: Principal | ICD-10-CM

## 2015-12-11 DIAGNOSIS — M778 Other enthesopathies, not elsewhere classified: Secondary | ICD-10-CM

## 2015-12-11 DIAGNOSIS — M7752 Other enthesopathy of left foot: Secondary | ICD-10-CM

## 2015-12-11 NOTE — Progress Notes (Signed)
She presents today for follow-up of her capsulitis second metatarsophalangeal joint of the left foot. She states that it is doing much better. She says I have absolutely no pain.  Objective: Vital signs are stable she's alert and 3. There is no erythema edema cellulitis drainage or odor. She has no pain on range of motion of the second metatarsophalangeal joint of the left foot.  Assessment: Well-healing capsulitis 100% second metatarsophalangeal joint left foot.  Plan: Follow up with me on an as-needed basis.

## 2015-12-19 ENCOUNTER — Encounter: Payer: Self-pay | Admitting: Cardiology

## 2015-12-19 ENCOUNTER — Ambulatory Visit (INDEPENDENT_AMBULATORY_CARE_PROVIDER_SITE_OTHER): Payer: BC Managed Care – PPO | Admitting: Cardiology

## 2015-12-19 ENCOUNTER — Ambulatory Visit: Payer: BC Managed Care – PPO | Admitting: Cardiology

## 2015-12-19 VITALS — BP 122/79 | HR 81 | Ht 62.0 in | Wt 148.0 lb

## 2015-12-19 DIAGNOSIS — F17201 Nicotine dependence, unspecified, in remission: Secondary | ICD-10-CM | POA: Diagnosis not present

## 2015-12-19 DIAGNOSIS — R0789 Other chest pain: Secondary | ICD-10-CM

## 2015-12-19 DIAGNOSIS — Z8249 Family history of ischemic heart disease and other diseases of the circulatory system: Secondary | ICD-10-CM

## 2015-12-19 NOTE — Progress Notes (Signed)
Cardiology Office Note  Date: 12/19/2015   ID: Gabrielle Ruiz, DOB [birth year], MRN [id number]  PCP: Glo Herring., MD  Consulting Cardiologist: Rozann Lesches, MD   Chief Complaint  Patient presents with  . Cardiac evaluation    History of Present Illness: Gabrielle Ruiz is a 55 y.o. female referred for cardiology consultation by Ms. Rankin NP at [facility]. She has a family history including premature CAD in a brother, valvular heart disease in her mother at older age. She does not have any personal history of heart problems, no hypertension or diabetes mellitus. She has been exercising recently through a running program with her kids at school and also a [employer] class at [employer]. She does not experience chest discomfort specifically with exercise, but later when she is more still she feels a pressure-like sensation in her chest. She has never undergone any type of stress testing for additional risk stratification.  I reviewed her ECG today which shows normal sinus rhythm.  I also reviewed her recent lab work, HDL 86 and LDL 92.  Past Medical History:  Diagnosis Date  . Asthma    Exercise and Allergy induced  . Hematuria 10/22/2014  . Menopausal state 10/16/2013  . Rectocele   . Seasonal allergies     Past Surgical History:  Procedure Laterality Date  . COLONOSCOPY  10/19/2011   Procedure: COLONOSCOPY;  Surgeon: Danie Binder, MD;  [facility];  Service: Endoscopy;  Laterality: N/A;  10:15 AM  . Hymen ring surgically removed      Current Outpatient Prescriptions  Medication Sig Dispense Refill  . albuterol (PROVENTIL HFA;VENTOLIN HFA) 108 (90 BASE) MCG/ACT inhaler Inhale 2 puffs into the lungs every 6 (six) hours as needed. For shortness of breath    . aspirin 81 MG tablet Take 81 mg by mouth daily.    . fluticasone (FLONASE) 50 MCG/ACT nasal spray Place 2 sprays into the nose as needed.     . Ginger, Zingiber officinalis, (GINGER PO) Take by  mouth daily.    Marland Kitchen loratadine (CLARITIN) 10 MG tablet Take 10 mg by mouth daily.    . Multiple Vitamin (MULTIVITAMIN WITH MINERALS) TABS Take 1 tablet by mouth daily.    . Multiple Vitamins-Minerals (EYE VITAMINS PO) Take by mouth 2 (two) times daily.     No current facility-administered medications for this visit.    Allergies:  Erythromycin and Amoxicillin   Social History: The patient  reports that she has quit smoking. Her smoking use included Cigarettes. She has a 7.50 pack-year smoking history. She has never used smokeless tobacco. She reports that she drinks about 0.6 oz of alcohol per week . She reports that she does not use drugs.   Family History: The patient's family history includes Breast cancer in her maternal aunt; Cataracts in her maternal grandfather; Heart attack in her paternal grandfather; Hypertension in her father; Osteoporosis in her maternal grandmother and mother; Other in her brother, father, mother, and paternal grandmother; Raynaud syndrome in her father.   ROS:  Please see the history of present illness. Otherwise, complete review of systems is positive for none.  All other systems are reviewed and negative.   Physical Exam: VS:  BP 122/79   Pulse 81   Ht 5\' 2"  (1.575 m)   Wt 148 lb (67.1 kg)   LMP 10/09/2013   SpO2 98%   BMI 27.07 kg/m , BMI Body mass index is 27.07 kg/m.  Wt Readings from  Last 3 Encounters:  12/19/15 148 lb (67.1 kg)  11/10/15 150 lb (68 kg)  10/22/14 145 lb 8 oz (66 kg)    General: Patient appears comfortable at rest. HEENT: Conjunctiva and lids normal, oropharynx clear. Neck: Supple, no elevated JVP or carotid bruits, no thyromegaly. Lungs: Clear to auscultation, nonlabored breathing at rest. Cardiac: Regular rate and rhythm, no S3 or significant systolic murmur, no pericardial rub. Abdomen: Soft, nontender, bowel sounds present, no guarding or rebound. Extremities: No pitting edema, distal pulses 2+. Skin: Warm and  dry. Musculoskeletal: No kyphosis. Neuropsychiatric: Alert and oriented x3, affect grossly appropriate.  ECG: No old tracing available for comparison.  Recent Labwork:    Component Value Date/Time   CHOL 200 10/16/2013 1145   TRIG 71 10/16/2013 1145   HDL 80 10/16/2013 1145   CHOLHDL 2.5 10/16/2013 1145   VLDL 14 10/16/2013 1145   LDLCALC 106 (H) 10/16/2013 1145  August 2017: Hemoglobin 13.5, platelets 222, BUN 13, creatinine 0.8, potassium 4.0, AST 14, ALT 11, cholesterol 189, triglycerides 53, HDL 86, LDL 92, TSH 4.4  Assessment and Plan:  1. Atypical chest pain in a 55 year old woman with a history of premature CAD in her brother (in his 19s), remote history of tobacco abuse, recent lipid panel showing LDL 92. Baseline ECG is normal. No significant heart murmur on examination to suspect valvular heart disease. We will plan an exercise echocardiogram for objective ischemic evaluation.   2. Remote tobacco use history, none for at least 15 years.  3. History of exercise-induced asthma. Does not seem to be limiting recently with her exercise routine.  Current medicines were reviewed with the patient today.   Orders Placed This Encounter  Procedures  . EKG 12-Lead  . ECHOCARDIOGRAM STRESS TEST    Disposition: Call with test results.  Signed, Satira Sark, MD, [facility] 12/19/2015 2:43 PM    [place] [facility] [address] [place], [place] [address] Phone: [phone]; Fax: [phone]

## 2015-12-19 NOTE — Patient Instructions (Signed)
Your physician recommends that you schedule a follow-up appointment in:  To be determined   Your physician has requested that you have a stress echocardiogram. For further information please visit [email or web address]. Please follow instruction sheet as given.    Your physician recommends that you continue on your current medications as directed. Please refer to the Current Medication list given to you today.    Thank you for choosing [facility] !

## 2015-12-30 ENCOUNTER — Ambulatory Visit (HOSPITAL_COMMUNITY)
Admission: RE | Admit: 2015-12-30 | Discharge: 2015-12-30 | Disposition: A | Discharge status: Home / Self Care | Payer: BC Managed Care – PPO | Source: Ambulatory Visit | Attending: Cardiology | Admitting: Cardiology

## 2015-12-30 DIAGNOSIS — Z8249 Family history of ischemic heart disease and other diseases of the circulatory system: Secondary | ICD-10-CM | POA: Diagnosis present

## 2015-12-30 DIAGNOSIS — R0789 Other chest pain: Secondary | ICD-10-CM | POA: Insufficient documentation

## 2015-12-30 LAB — ECHOCARDIOGRAM STRESS TEST
CHL CUP RESTING HR STRESS: 63 {beats}/min
CHL RATE OF PERCEIVED EXERTION: 14
CSEPED: 10 min
CSEPEW: 13.4 METS
CSEPPHR: 173 {beats}/min
Exercise duration (sec): 8 s
MPHR: 166 {beats}/min
Percent HR: 104 %

## 2015-12-30 NOTE — Progress Notes (Signed)
*  PRELIMINARY RESULTS* Echocardiogram Echocardiogram Stress Test has been performed.  Samuel Germany 12/30/2015, 10:11 AM

## 2016-10-13 ENCOUNTER — Other Ambulatory Visit: Payer: Self-pay | Admitting: Obstetrics and Gynecology

## 2016-10-13 DIAGNOSIS — Z1231 Encounter for screening mammogram for malignant neoplasm of breast: Secondary | ICD-10-CM

## 2016-10-14 ENCOUNTER — Ambulatory Visit (HOSPITAL_COMMUNITY)
Admission: RE | Admit: 2016-10-14 | Discharge: 2016-10-14 | Disposition: A | Discharge status: Home / Self Care | Payer: BC Managed Care – PPO | Source: Ambulatory Visit | Attending: Obstetrics and Gynecology | Admitting: Obstetrics and Gynecology

## 2016-10-14 DIAGNOSIS — Z1231 Encounter for screening mammogram for malignant neoplasm of breast: Secondary | ICD-10-CM | POA: Insufficient documentation

## 2016-10-18 ENCOUNTER — Other Ambulatory Visit: Payer: Self-pay | Admitting: Obstetrics and Gynecology

## 2016-10-18 DIAGNOSIS — R928 Other abnormal and inconclusive findings on diagnostic imaging of breast: Secondary | ICD-10-CM

## 2016-10-26 ENCOUNTER — Ambulatory Visit (HOSPITAL_COMMUNITY)
Admission: RE | Admit: 2016-10-26 | Discharge: 2016-10-26 | Disposition: A | Discharge status: Home / Self Care | Payer: BC Managed Care – PPO | Source: Ambulatory Visit | Attending: Obstetrics and Gynecology | Admitting: Obstetrics and Gynecology

## 2016-10-26 DIAGNOSIS — N6311 Unspecified lump in the right breast, upper outer quadrant: Secondary | ICD-10-CM | POA: Diagnosis not present

## 2016-10-26 DIAGNOSIS — R928 Other abnormal and inconclusive findings on diagnostic imaging of breast: Secondary | ICD-10-CM

## 2016-11-02 ENCOUNTER — Other Ambulatory Visit (HOSPITAL_COMMUNITY): Payer: BC Managed Care – PPO

## 2017-05-12 ENCOUNTER — Other Ambulatory Visit: Payer: Self-pay | Admitting: *Deleted

## 2017-05-12 ENCOUNTER — Telehealth: Payer: Self-pay | Admitting: Obstetrics and Gynecology

## 2017-05-12 DIAGNOSIS — N6310 Unspecified lump in the right breast, unspecified quadrant: Secondary | ICD-10-CM

## 2017-05-12 NOTE — Telephone Encounter (Signed)
LMOVM to return my call.   Mammogram scheduled for 05/24/16 @ 3:20. Pt to arrive at 3:10, no lotions or powders.

## 2017-05-16 ENCOUNTER — Other Ambulatory Visit: Payer: Self-pay | Admitting: *Deleted

## 2017-05-16 DIAGNOSIS — R928 Other abnormal and inconclusive findings on diagnostic imaging of breast: Secondary | ICD-10-CM

## 2017-05-24 ENCOUNTER — Ambulatory Visit (HOSPITAL_COMMUNITY)
Admission: RE | Admit: 2017-05-24 | Discharge: 2017-05-24 | Disposition: A | Discharge status: Home / Self Care | Payer: BC Managed Care – PPO | Source: Ambulatory Visit | Attending: Obstetrics and Gynecology | Admitting: Obstetrics and Gynecology

## 2017-05-24 ENCOUNTER — Ambulatory Visit (HOSPITAL_COMMUNITY)
Admission: RE | Admit: 2017-05-24 | Discharge: 2017-05-24 | Disposition: A | Discharge status: Home / Self Care | Payer: BC Managed Care – PPO | Source: Ambulatory Visit | Attending: Adult Health | Admitting: Adult Health

## 2017-05-24 ENCOUNTER — Encounter (HOSPITAL_COMMUNITY): Payer: BC Managed Care – PPO

## 2017-05-24 DIAGNOSIS — N6310 Unspecified lump in the right breast, unspecified quadrant: Secondary | ICD-10-CM

## 2017-05-24 DIAGNOSIS — R928 Other abnormal and inconclusive findings on diagnostic imaging of breast: Secondary | ICD-10-CM

## 2017-05-30 NOTE — Telephone Encounter (Signed)
LMOVM stable breast mass continue following every 6 months.

## 2018-06-20 ENCOUNTER — Telehealth: Payer: Self-pay | Admitting: Adult Health

## 2018-06-20 NOTE — Telephone Encounter (Signed)
Pt states she is due for a right breast mammogram and she would like to see if the order can be sent to [facility]. She states that she has been having some pain in that breast and would like to try and have the mammogram done next Tuesday. Please advise.

## 2018-06-23 ENCOUNTER — Telehealth: Payer: Self-pay | Admitting: Adult Health

## 2018-06-23 DIAGNOSIS — N644 Mastodynia: Secondary | ICD-10-CM

## 2018-06-23 DIAGNOSIS — N6310 Unspecified lump in the right breast, unspecified quadrant: Secondary | ICD-10-CM

## 2018-06-23 NOTE — Telephone Encounter (Signed)
Pt missed F/U on rt breast, has hx mass and now has pain, will place order for DX bilateral mammogram and [place]

## 2018-06-23 NOTE — Addendum Note (Signed)
Addended by: Derrek Monaco A on: 06/23/2018 02:46 PM   Modules accepted: Orders

## 2018-06-23 NOTE — Telephone Encounter (Signed)
I advised pt that she needs to make appt per Jennifer's note. SHe said she did not need an appointment we just needed to send her order over,  that what could we do here for her breast pain she needs a mammo. I advised pt that she would have to speak to a nurse or Anderson Malta and I would take another message

## 2018-06-27 ENCOUNTER — Ambulatory Visit (HOSPITAL_COMMUNITY): Payer: BC Managed Care – PPO

## 2018-06-27 ENCOUNTER — Other Ambulatory Visit: Payer: Self-pay

## 2018-06-27 ENCOUNTER — Ambulatory Visit (HOSPITAL_COMMUNITY)
Admission: RE | Admit: 2018-06-27 | Discharge: 2018-06-27 | Disposition: A | Discharge status: Home / Self Care | Payer: BC Managed Care – PPO | Source: Ambulatory Visit | Attending: Adult Health | Admitting: Adult Health

## 2018-06-27 DIAGNOSIS — N6310 Unspecified lump in the right breast, unspecified quadrant: Secondary | ICD-10-CM | POA: Insufficient documentation

## 2018-06-27 DIAGNOSIS — N644 Mastodynia: Secondary | ICD-10-CM | POA: Insufficient documentation

## 2018-09-19 ENCOUNTER — Other Ambulatory Visit: Payer: Self-pay

## 2018-09-19 ENCOUNTER — Other Ambulatory Visit: Payer: BC Managed Care – PPO

## 2018-09-19 DIAGNOSIS — Z20822 Contact with and (suspected) exposure to covid-19: Secondary | ICD-10-CM

## 2018-09-25 LAB — NOVEL CORONAVIRUS, NAA: SARS-CoV-2, NAA: NOT DETECTED

## 2019-05-07 ENCOUNTER — Other Ambulatory Visit: Payer: Self-pay

## 2019-05-07 ENCOUNTER — Ambulatory Visit: Payer: BC Managed Care – PPO | Attending: Internal Medicine

## 2019-05-07 DIAGNOSIS — Z20822 Contact with and (suspected) exposure to covid-19: Secondary | ICD-10-CM

## 2019-05-08 ENCOUNTER — Ambulatory Visit: Payer: BC Managed Care – PPO

## 2019-05-09 LAB — NOVEL CORONAVIRUS, NAA: SARS-CoV-2, NAA: NOT DETECTED

## 2019-06-12 ENCOUNTER — Ambulatory Visit: Payer: BC Managed Care – PPO | Admitting: Adult Health

## 2019-08-23 ENCOUNTER — Ambulatory Visit: Payer: BC Managed Care – PPO | Admitting: Adult Health

## 2019-08-23 ENCOUNTER — Encounter: Payer: Self-pay | Admitting: Adult Health

## 2019-08-23 VITALS — BP 113/75 | HR 64 | Ht 62.0 in | Wt 151.6 lb

## 2019-08-23 DIAGNOSIS — N6323 Unspecified lump in the left breast, lower outer quadrant: Secondary | ICD-10-CM | POA: Diagnosis not present

## 2019-08-23 DIAGNOSIS — Z872 Personal history of diseases of the skin and subcutaneous tissue: Secondary | ICD-10-CM | POA: Diagnosis not present

## 2019-08-23 NOTE — Progress Notes (Signed)
  Subjective:     Patient ID: Gabrielle Ruiz, female   DOB: 1960/04/15, 60 y.o.   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 She has had COVID vaccine. She has pap and physical June 30th. PCP is [place].  Review of Systems Left breast mass, hx cysts Reviewed past medical,surgical, social and family history. Reviewed medications and allergies.     Objective:   Physical Exam BP 113/75 (BP Location: Left Arm, Patient Position: Sitting, Cuff Size: Normal)   Pulse 64   Ht 5\' 2"  (1.575 m)   Wt 151 lb 9.6 oz (68.8 kg)   LMP 10/09/2013   BMI 27.73 kg/m   Skin warm and dry,  Breasts:no dominate palpable mass, retraction or nipple discharge on the right, on the left has a mass at 12 o'clock and another at 3-4 0'clock     Assessment:     1. Mass of lower outer quadrant of left breast Scheduled diagnostic mammogram and right and left breast US 6/22 at [facility] at 11 am   2. History of cyst of breast     Plan:     Pap and physical June 30th.

## 2019-09-11 ENCOUNTER — Ambulatory Visit (HOSPITAL_COMMUNITY)
Admission: RE | Admit: 2019-09-11 | Discharge: 2019-09-11 | Disposition: A | Discharge status: Home / Self Care | Payer: BC Managed Care – PPO | Source: Ambulatory Visit | Attending: Adult Health | Admitting: Adult Health

## 2019-09-11 ENCOUNTER — Other Ambulatory Visit: Payer: Self-pay

## 2019-09-11 DIAGNOSIS — N6323 Unspecified lump in the left breast, lower outer quadrant: Secondary | ICD-10-CM

## 2019-09-11 DIAGNOSIS — Z872 Personal history of diseases of the skin and subcutaneous tissue: Secondary | ICD-10-CM | POA: Insufficient documentation

## 2019-09-19 ENCOUNTER — Ambulatory Visit (INDEPENDENT_AMBULATORY_CARE_PROVIDER_SITE_OTHER): Payer: BC Managed Care – PPO | Admitting: Adult Health

## 2019-09-19 ENCOUNTER — Encounter: Payer: Self-pay | Admitting: Adult Health

## 2019-09-19 ENCOUNTER — Other Ambulatory Visit (HOSPITAL_COMMUNITY)
Admission: RE | Admit: 2019-09-19 | Discharge: 2019-09-19 | Disposition: A | Discharge status: Home / Self Care | Payer: BC Managed Care – PPO | Source: Ambulatory Visit | Attending: Adult Health | Admitting: Adult Health

## 2019-09-19 VITALS — BP 119/81 | HR 66 | Ht 61.5 in | Wt 150.0 lb

## 2019-09-19 DIAGNOSIS — N8189 Other female genital prolapse: Secondary | ICD-10-CM | POA: Diagnosis not present

## 2019-09-19 DIAGNOSIS — N816 Rectocele: Secondary | ICD-10-CM | POA: Diagnosis not present

## 2019-09-19 DIAGNOSIS — Z01419 Encounter for gynecological examination (general) (routine) without abnormal findings: Secondary | ICD-10-CM | POA: Insufficient documentation

## 2019-09-19 DIAGNOSIS — Z1211 Encounter for screening for malignant neoplasm of colon: Secondary | ICD-10-CM | POA: Diagnosis not present

## 2019-09-19 DIAGNOSIS — N898 Other specified noninflammatory disorders of vagina: Secondary | ICD-10-CM

## 2019-09-19 DIAGNOSIS — N362 Urethral caruncle: Secondary | ICD-10-CM

## 2019-09-19 LAB — HEMOCCULT GUIAC POC 1CARD (OFFICE): Fecal Occult Blood, POC: NEGATIVE

## 2019-09-19 NOTE — Progress Notes (Signed)
Patient ID: Gabrielle Ruiz, female   DOB: [birth year], 59 y.o.   MRN: [id number] History of Present Illness:  Gabrielle Ruiz is a 59 year old white female,married, PM in for a well woman gyn exam and pap. PCP is [place]   Current Medications, Allergies, Past Medical History, Past Surgical History, Family History and Social History were reviewed in [employer] record.     Review of Systems: Patient denies any headaches, hearing loss, fatigue, blurred vision, shortness of breath, chest pain, abdominal pain, problems with bowel movements, urination, or intercourse. No joint pain or mood swings.    Physical Exam:BP 119/81 (BP Location: Left Arm, Patient Position: Sitting, Cuff Size: Normal)   Pulse 66   Ht 5' 1.5" (1.562 m)   Wt 150 lb (68 kg)   LMP 10/09/2013   BMI 27.88 kg/m  General:  Well developed, well nourished, no acute distress Skin:  Warm and dry Neck:  Midline trachea, normal thyroid, good ROM, no lymphadenopathy Lungs; Clear to auscultation bilaterally Breast:  No dominant palpable mass, retraction, or nipple discharge Cardiovascular: Regular rate and rhythm Abdomen:  Soft, non tender, no hepatosplenomegaly Pelvic:  External genitalia is normal in appearance, no lesions.Hair thinning on mons pubis.  The vagina is pale with loss of moisture and rugae. Urethra:has caruncle, red, non tender. The cervix is bulbous,pap with high risk HPV 16/18 genotyping performed, has some descensus.  Uterus is felt to be normal size, shape, and contour.  No adnexal masses or tenderness noted.Bladder is non tender, no masses felt. Rectal: Good sphincter tone, no polyps, or hemorrhoids felt.  Hemoccult negative.+rectocele. Extremities/musculoskeletal:  No swelling or varicosities noted, no clubbing or cyanosis Psych:  No mood changes, alert and cooperative,seems happy AA is 2 Fall risk is low PHQ 9 score is 0. Examination chaperoned by Levy Pupa LPN  Impression and  Plan: 1. Encounter for gynecological examination with Papanicolaou smear of cervix Pap sent Physical in 1 year Pap in 3 if normal Check CBC,CMP,TSH and lipids Mammogram yearly  2. Encounter for screening fecal occult blood testing Colonoscopy 2023  3. Rectocele  4. Pelvic relaxation  5. Urethral caruncle Just follow  6. Vaginal dryness Try astroglide, ky with sex Can get replens or luvena if needed

## 2019-09-21 LAB — CYTOLOGY - PAP
Comment: NEGATIVE
Diagnosis: NEGATIVE
High risk HPV: NEGATIVE

## 2020-08-19 ENCOUNTER — Other Ambulatory Visit (HOSPITAL_COMMUNITY): Payer: Self-pay | Admitting: Family Medicine

## 2020-08-20 ENCOUNTER — Other Ambulatory Visit (HOSPITAL_COMMUNITY): Payer: Self-pay | Admitting: Family Medicine

## 2020-08-20 DIAGNOSIS — Z13820 Encounter for screening for osteoporosis: Secondary | ICD-10-CM

## 2020-10-16 ENCOUNTER — Other Ambulatory Visit (HOSPITAL_COMMUNITY): Payer: Self-pay | Admitting: Adult Health

## 2020-10-16 DIAGNOSIS — Z1231 Encounter for screening mammogram for malignant neoplasm of breast: Secondary | ICD-10-CM

## 2020-10-27 ENCOUNTER — Ambulatory Visit (HOSPITAL_COMMUNITY)
Admission: RE | Admit: 2020-10-27 | Discharge: 2020-10-27 | Disposition: A | Discharge status: Home / Self Care | Payer: BC Managed Care – PPO | Source: Ambulatory Visit | Attending: Family Medicine | Admitting: Family Medicine

## 2020-10-27 ENCOUNTER — Other Ambulatory Visit: Payer: Self-pay

## 2020-10-27 ENCOUNTER — Ambulatory Visit (HOSPITAL_COMMUNITY)
Admission: RE | Admit: 2020-10-27 | Discharge: 2020-10-27 | Disposition: A | Discharge status: Home / Self Care | Payer: BC Managed Care – PPO | Source: Ambulatory Visit | Attending: Adult Health | Admitting: Adult Health

## 2020-10-27 DIAGNOSIS — Z13820 Encounter for screening for osteoporosis: Secondary | ICD-10-CM | POA: Diagnosis present

## 2020-10-27 DIAGNOSIS — Z1231 Encounter for screening mammogram for malignant neoplasm of breast: Secondary | ICD-10-CM | POA: Insufficient documentation

## 2020-11-03 ENCOUNTER — Other Ambulatory Visit (HOSPITAL_COMMUNITY): Payer: BC Managed Care – PPO

## 2021-01-05 IMAGING — US US BREAST*R* LIMITED INC AXILLA
1 series · 7 of 7 positions shown · non-contrast
Comparison: Previous exams.

CLINICAL DATA: Follow-up for probably benign right breast mass.

EXAM:
DIGITAL DIAGNOSTIC BILATERAL MAMMOGRAM WITH TOMO AND CAD; ULTRASOUND
RIGHT BREAST LIMITED
RIGHT BREAST ULTRASOUND

[Series 1: us breast*right* limited inc axilla · 7 of 7 slices shown]
[im 1/7]
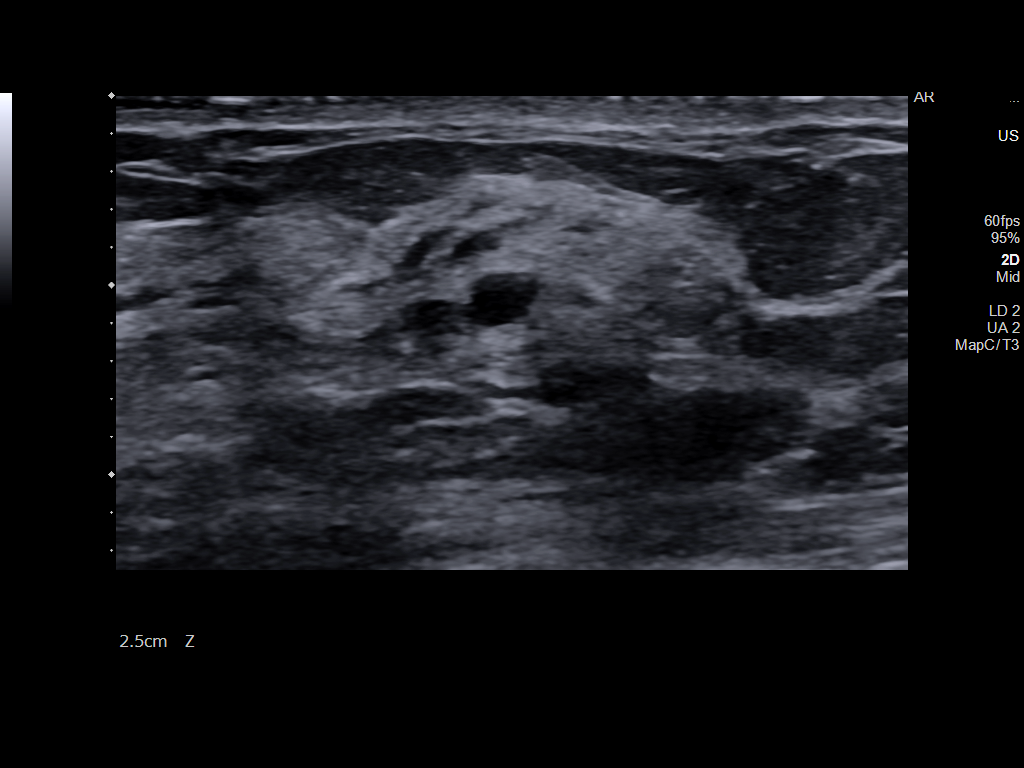
[im 2/7]
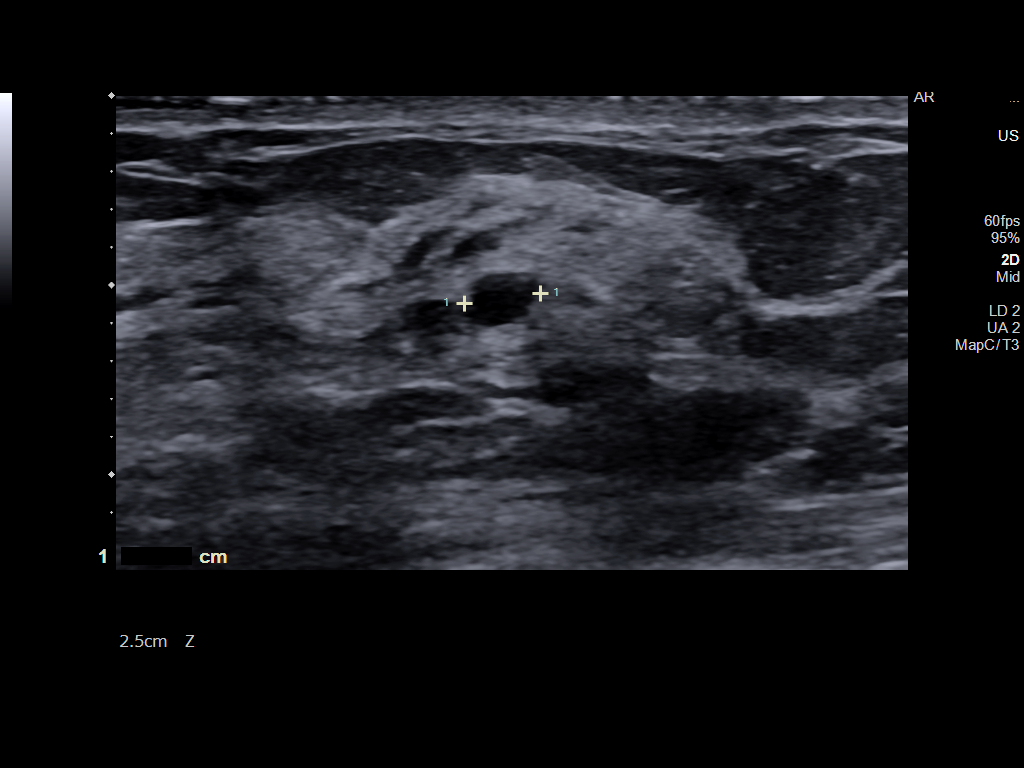
[im 3/7]
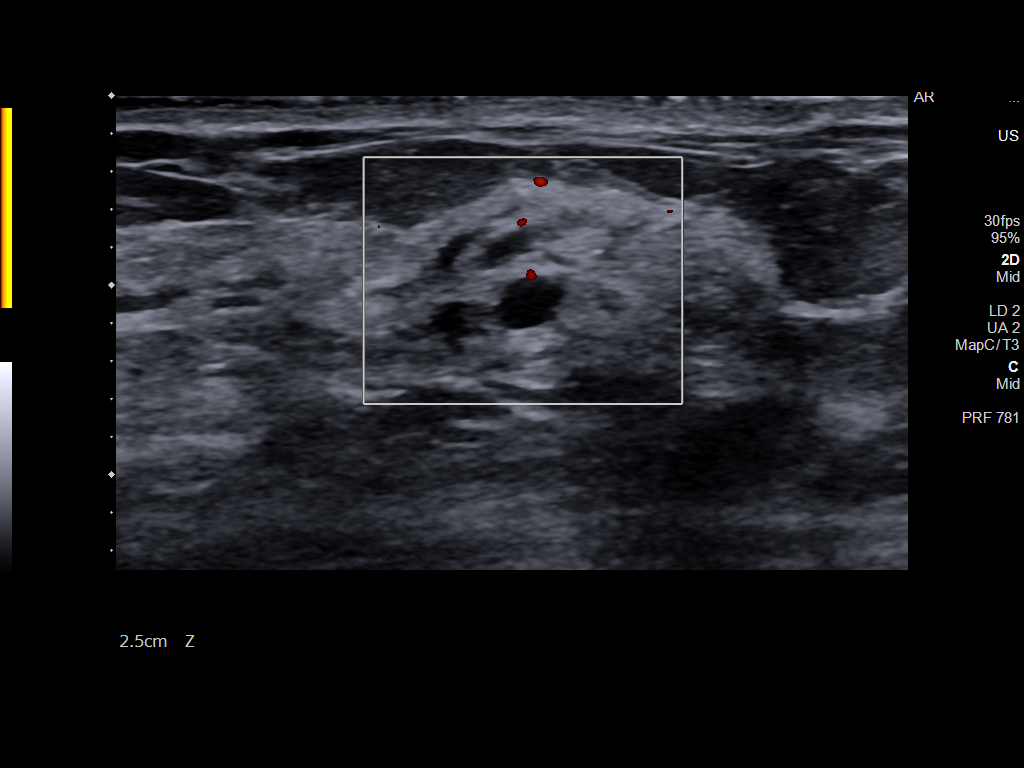
[im 4/7]
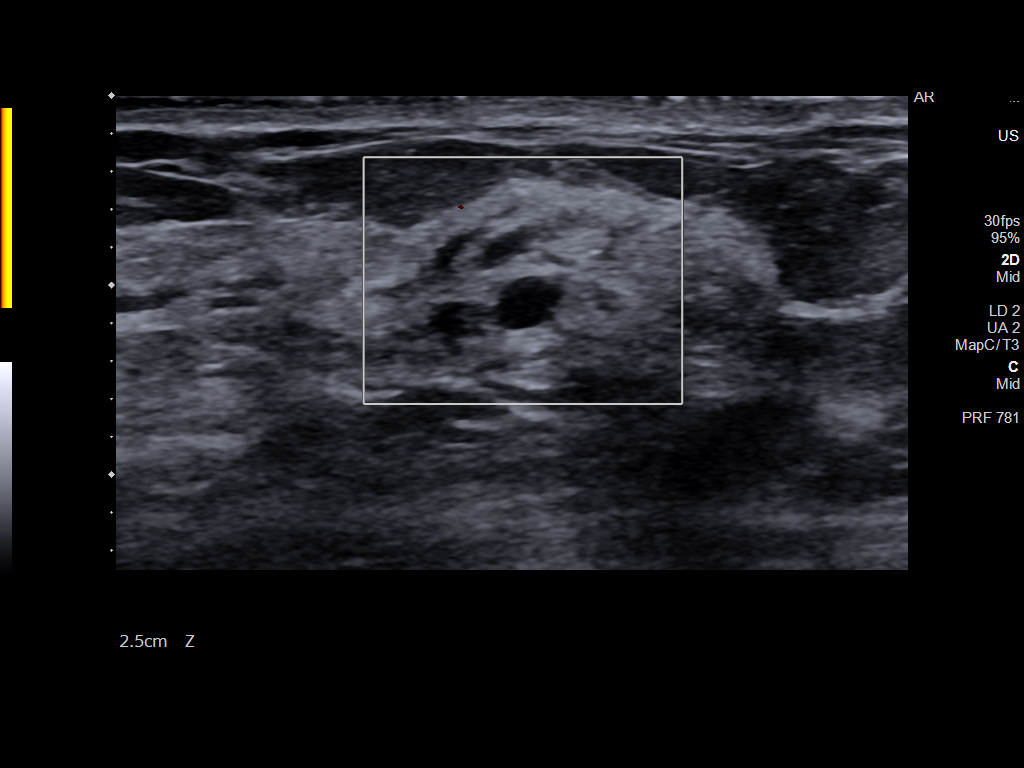
[im 5/7]
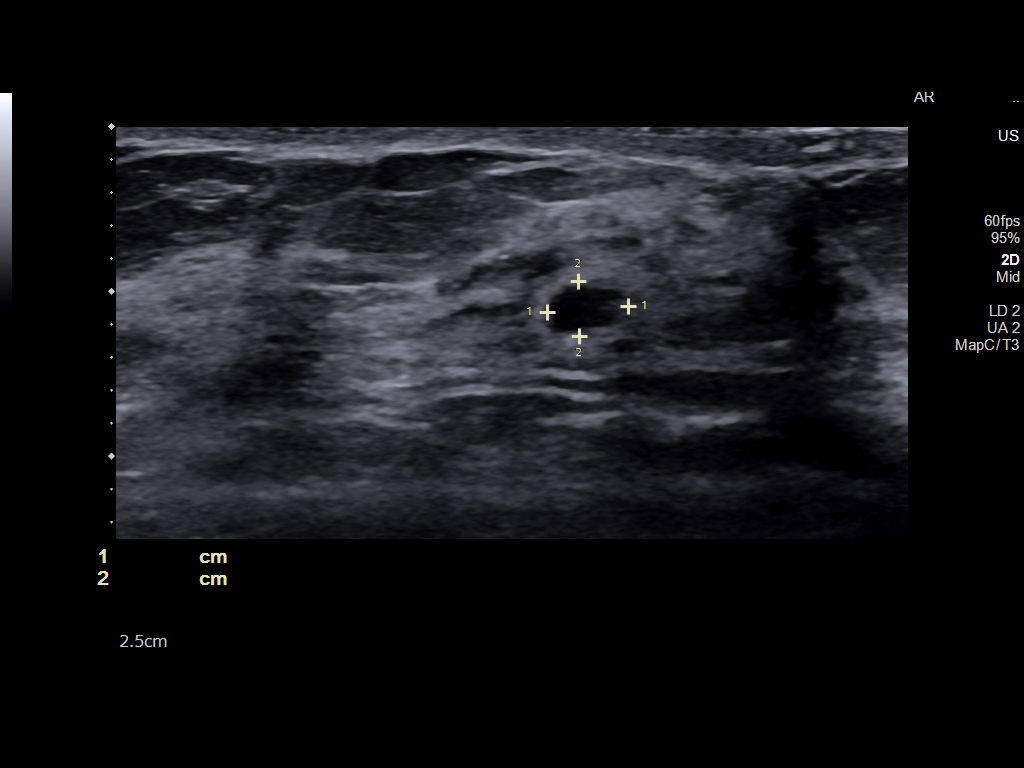
[im 6/7]
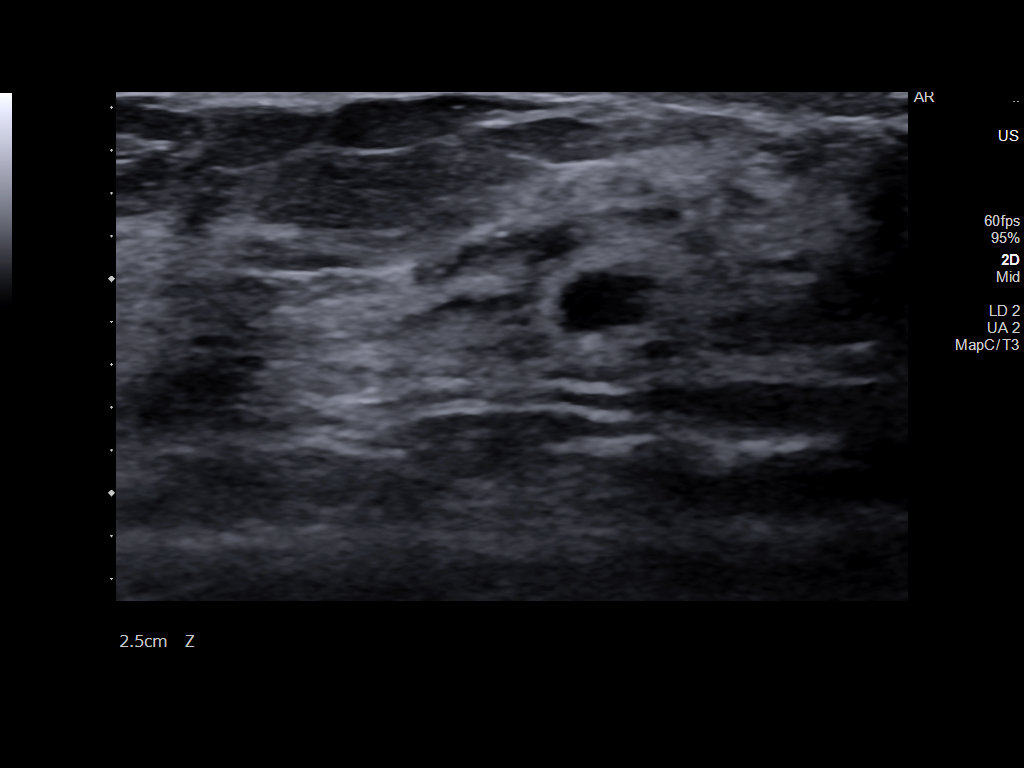
[im 7/7]
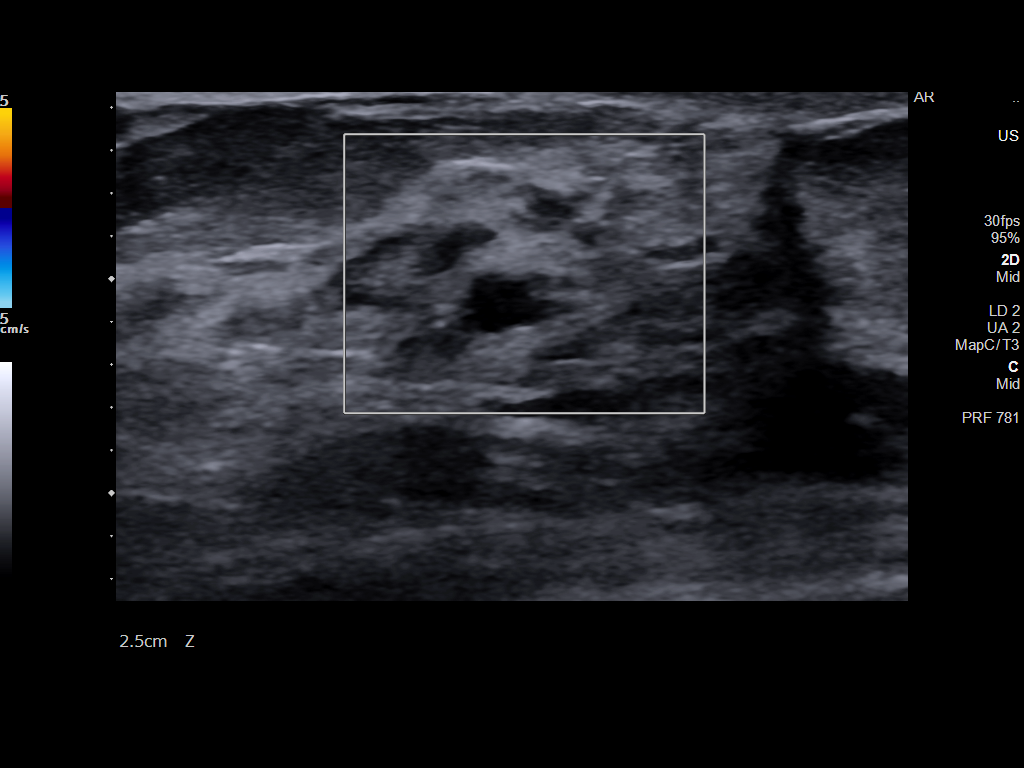

[7 of 7 positions shown; findings below may reference images not displayed]

ACR Breast Density Category c: The breast tissue is heterogeneously
dense, which may obscure small masses.
FINDINGS: No suspicious masses or calcifications are seen in either breast.
Bilateral oval and round masses again identified most compatible
with fluctuating cysts, similar when compared to the prior exams.
There is no mammographic evidence of malignancy in either breast.

Mammographic images were processed with CAD.

Targeted ultrasound of the lower outer right breast was performed.
The mildly complicated cyst in the right breast 8 o'clock 4 cm from
nipple measures 0.4 x 0.3 x 0.5 cm, stable to slightly smaller when
compared to the prior exam and considered benign.
IMPRESSION: No findings of malignancy in either breast.

RECOMMENDATION:
Screening mammogram in one year.(Code:K7-O-SO9)

I have discussed the findings and recommendations with the patient.
If applicable, a reminder letter will be sent to the patient
regarding the next appointment.

BI-RADS CATEGORY  2: Benign.

## 2021-09-11 ENCOUNTER — Encounter: Payer: Self-pay | Admitting: *Deleted

## 2022-02-21 IMAGING — MG MM DIGITAL SCREENING BILAT W/ TOMO AND CAD
8 series · 8 of 24 positions shown · non-contrast
Comparison: Previous exam(s).

CLINICAL DATA: Screening.

EXAM:
DIGITAL SCREENING BILATERAL MAMMOGRAM WITH TOMOSYNTHESIS AND CAD
TECHNIQUE: Bilateral screening digital craniocaudal and mediolateral oblique
mammograms were obtained. Bilateral screening digital breast
tomosynthesis was performed. The images were evaluated with
computer-aided detection.

[R CC synth-2D]
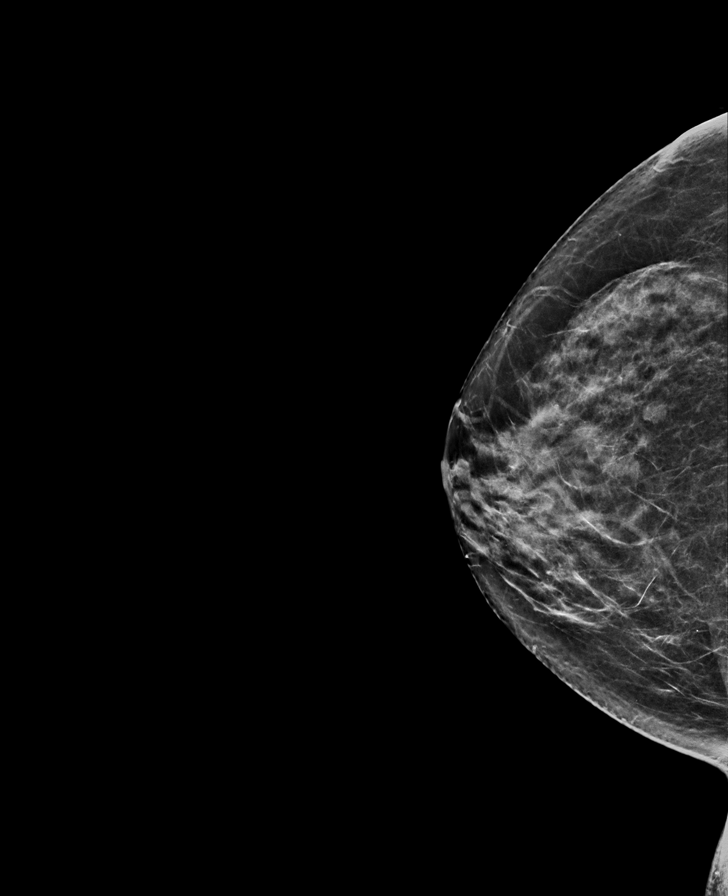

[R MLO synth-2D]
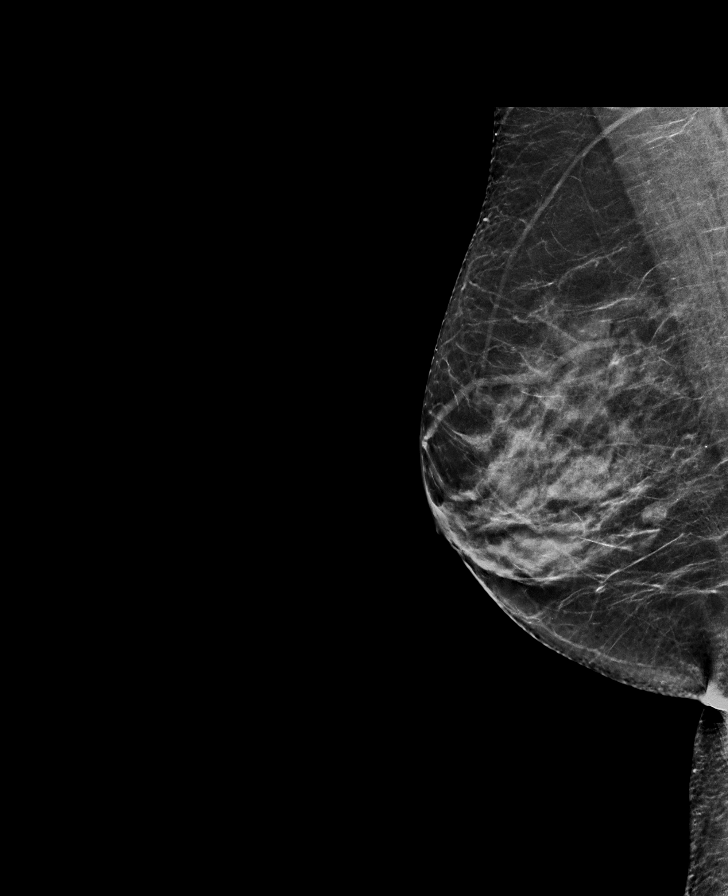

[L CC synth-2D]
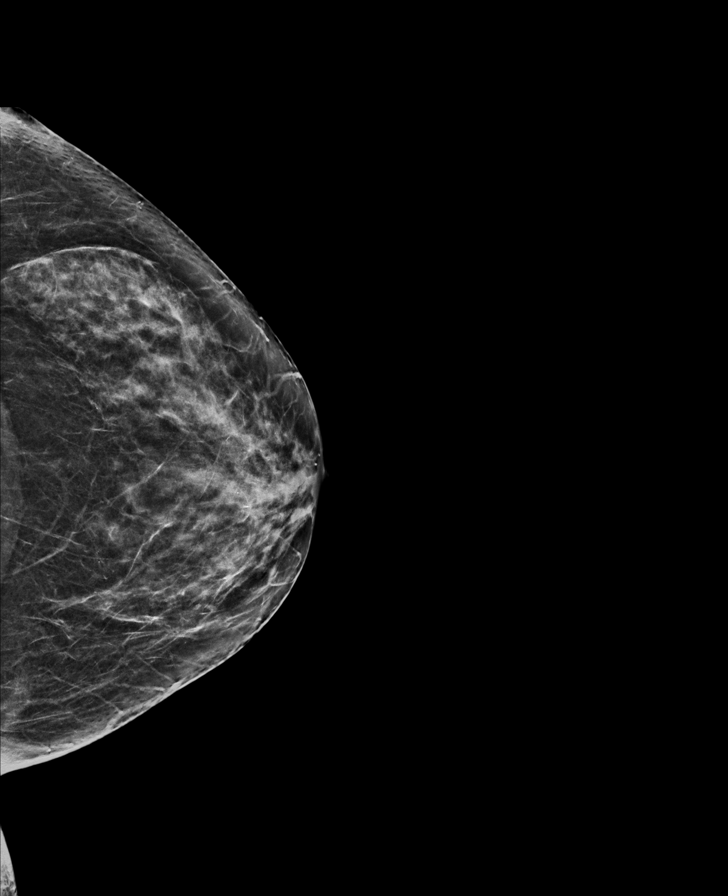

[L MLO synth-2D]
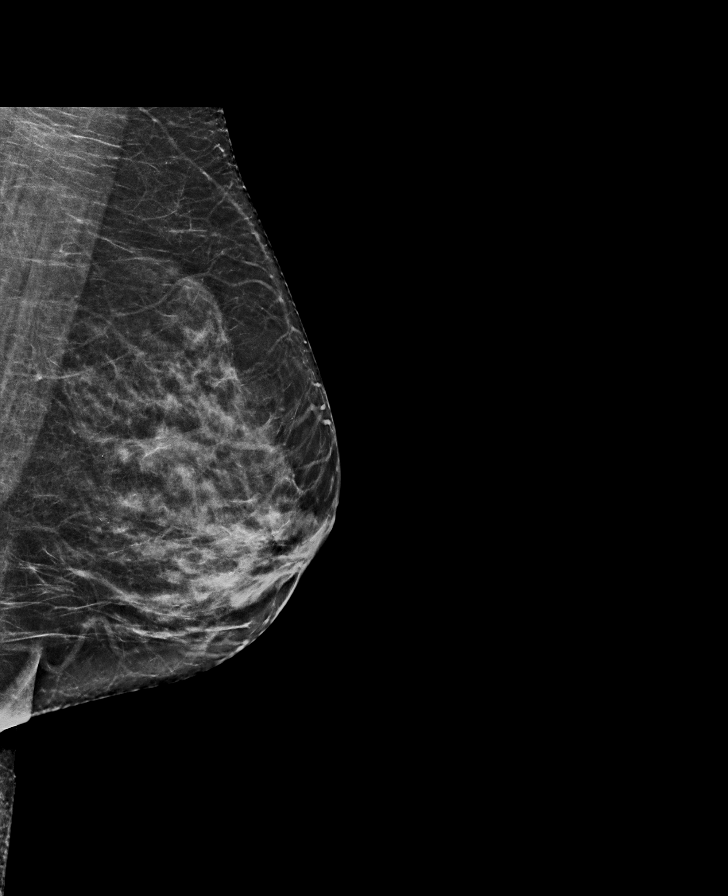

[R MLO tomo · tomo slice 33/65.0]
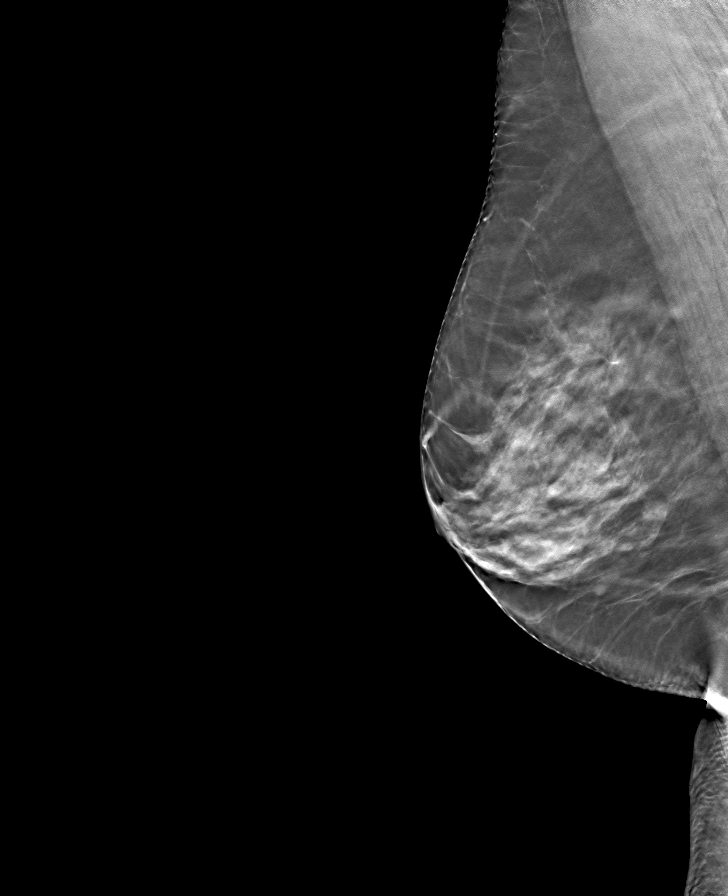

[R CC tomo · tomo slice 33/64.0]
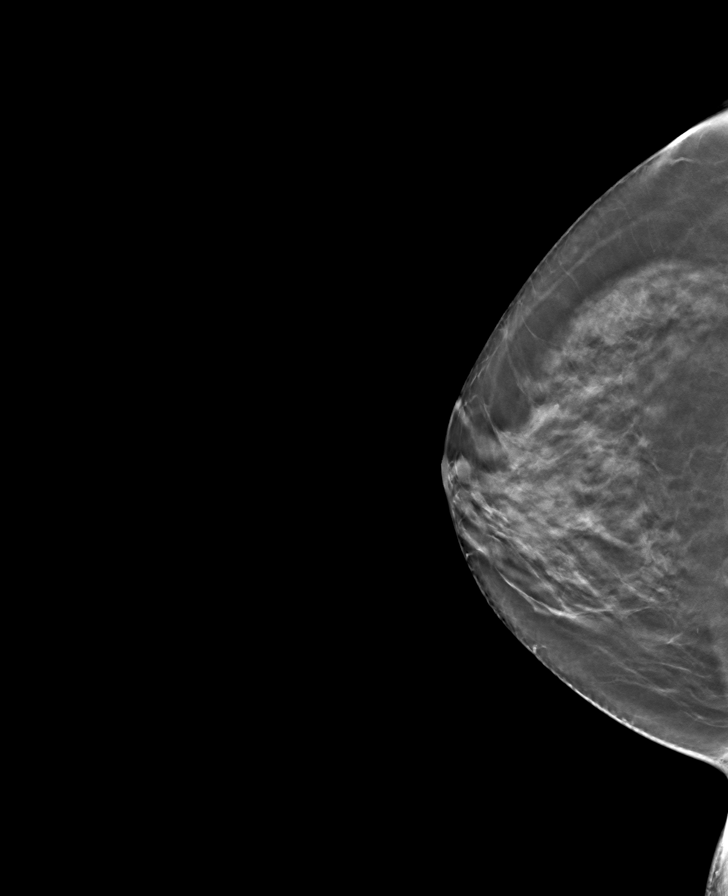

[L CC tomo · tomo slice 32/63.0]
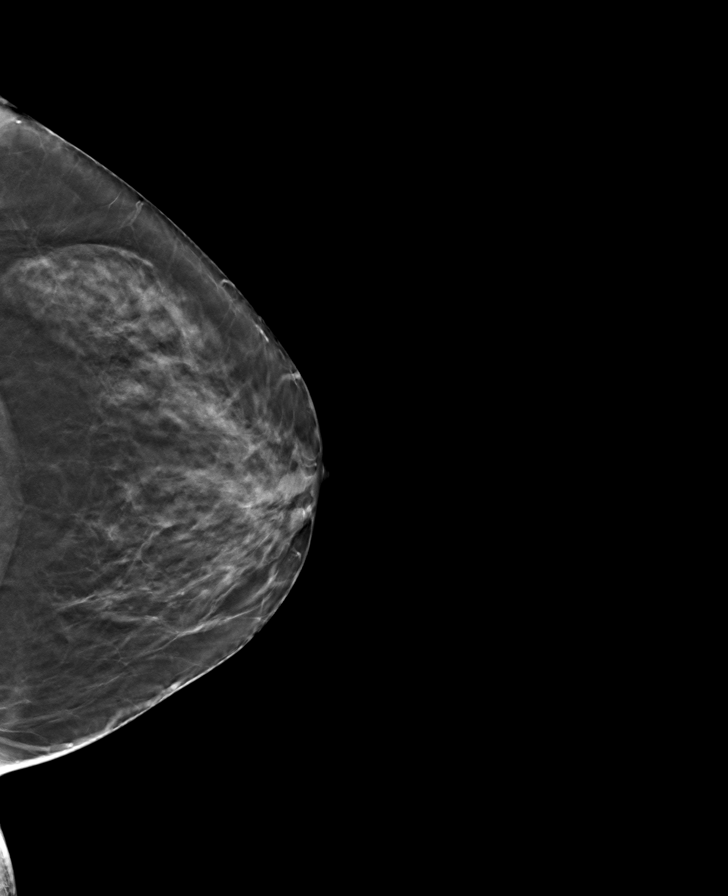

[L MLO tomo · tomo slice 34/67.0]
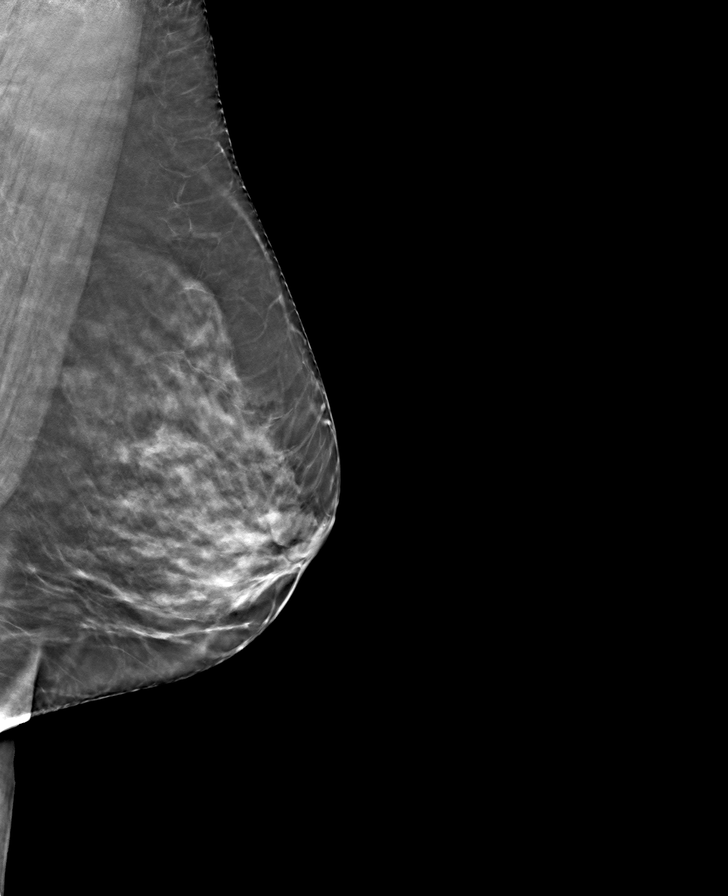

[8 of 24 positions shown; findings below may reference images not displayed]

ACR Breast Density Category c: The breast tissue is heterogeneously
dense, which may obscure small masses.
FINDINGS: There are no findings suspicious for malignancy.
IMPRESSION: No mammographic evidence of malignancy. A result letter of this
screening mammogram will be mailed directly to the patient.

RECOMMENDATION:
Screening mammogram in one year. (Code:Q3-W-BC3)

BI-RADS CATEGORY  1: Negative.

## 2022-03-10 ENCOUNTER — Other Ambulatory Visit (HOSPITAL_COMMUNITY): Payer: Self-pay | Admitting: Adult Health

## 2022-03-10 DIAGNOSIS — Z1231 Encounter for screening mammogram for malignant neoplasm of breast: Secondary | ICD-10-CM

## 2022-04-26 ENCOUNTER — Ambulatory Visit (HOSPITAL_COMMUNITY)
Admission: RE | Admit: 2022-04-26 | Discharge: 2022-04-26 | Disposition: A | Discharge status: Home / Self Care | Payer: BC Managed Care – PPO | Source: Ambulatory Visit | Attending: Adult Health | Admitting: Adult Health

## 2022-04-26 DIAGNOSIS — Z1231 Encounter for screening mammogram for malignant neoplasm of breast: Secondary | ICD-10-CM

## 2022-05-24 ENCOUNTER — Ambulatory Visit (HOSPITAL_COMMUNITY)
Admission: RE | Admit: 2022-05-24 | Discharge: 2022-05-24 | Disposition: A | Discharge status: Home / Self Care | Payer: BC Managed Care – PPO | Source: Ambulatory Visit | Attending: Family Medicine | Admitting: Family Medicine

## 2022-05-24 ENCOUNTER — Other Ambulatory Visit (HOSPITAL_COMMUNITY): Payer: Self-pay | Admitting: Family Medicine

## 2022-05-24 DIAGNOSIS — M79672 Pain in left foot: Secondary | ICD-10-CM | POA: Insufficient documentation

## 2023-06-03 ENCOUNTER — Other Ambulatory Visit (HOSPITAL_COMMUNITY): Payer: Self-pay | Admitting: Family Medicine

## 2023-06-03 DIAGNOSIS — Z1231 Encounter for screening mammogram for malignant neoplasm of breast: Secondary | ICD-10-CM

## 2023-06-13 ENCOUNTER — Inpatient Hospital Stay (HOSPITAL_COMMUNITY): Admission: RE | Admit: 2023-06-13 | Payer: Self-pay | Source: Ambulatory Visit

## 2023-06-20 ENCOUNTER — Encounter (HOSPITAL_COMMUNITY): Payer: Self-pay

## 2023-06-20 ENCOUNTER — Ambulatory Visit (HOSPITAL_COMMUNITY)
Admission: RE | Admit: 2023-06-20 | Discharge: 2023-06-20 | Disposition: A | Discharge status: Home / Self Care | Source: Ambulatory Visit | Attending: Family Medicine | Admitting: Family Medicine

## 2023-06-20 DIAGNOSIS — Z1231 Encounter for screening mammogram for malignant neoplasm of breast: Secondary | ICD-10-CM | POA: Insufficient documentation

## 2023-07-14 ENCOUNTER — Other Ambulatory Visit (HOSPITAL_COMMUNITY)
Admission: RE | Admit: 2023-07-14 | Discharge: 2023-07-14 | Disposition: A | Discharge status: Home / Self Care | Source: Ambulatory Visit | Attending: Obstetrics & Gynecology | Admitting: Obstetrics & Gynecology

## 2023-07-14 ENCOUNTER — Encounter: Payer: Self-pay | Admitting: Obstetrics & Gynecology

## 2023-07-14 ENCOUNTER — Ambulatory Visit: Admitting: Obstetrics & Gynecology

## 2023-07-14 VITALS — BP 146/87 | HR 68 | Ht 62.0 in | Wt 152.4 lb

## 2023-07-14 DIAGNOSIS — N812 Incomplete uterovaginal prolapse: Secondary | ICD-10-CM

## 2023-07-14 DIAGNOSIS — Z01411 Encounter for gynecological examination (general) (routine) with abnormal findings: Secondary | ICD-10-CM | POA: Diagnosis not present

## 2023-07-14 DIAGNOSIS — Z124 Encounter for screening for malignant neoplasm of cervix: Secondary | ICD-10-CM | POA: Diagnosis present

## 2023-07-14 NOTE — Progress Notes (Signed)
   GYN VISIT Patient name: Gabrielle Ruiz MRN [id number]  Date of birth: Sep 24, 1960 Chief Complaint:   Gynecologic Exam  History of Present Illness:   Gabrielle Ruiz is a 63 y.o. [phone] PM female being seen today for the following concerns:  Vaginal bulge: Tues night- while showering noting that there was something in the vagina.  Notes the area is not raw, but sensitive.  Denies pain, but rather discomfort.  Denies vaginal bleeding, discharge or itching.  Patient is sexually active and was concerned if that would be safe.  Reports no other acute GYN concerns  Some constipation constipation, but not straining  Patient's last menstrual period was 10/09/2013.    Review of Systems:   Pertinent items are noted in HPI Denies fever/chills, dizziness, headaches, visual disturbances, fatigue, shortness of breath, chest pain, abdominal pain, vomiting Pertinent History Reviewed:   Past Surgical History:  Procedure Laterality Date   COLONOSCOPY  10/19/2011   Procedure: COLONOSCOPY;  Surgeon: Alyce Jubilee, MD;  [facility];  Service: Endoscopy;  Laterality: N/A;  10:15 AM   Hymen ring surgically removed      Past Medical History:  Diagnosis Date   Asthma    Exercise and Allergy induced   Hematuria 10/22/2014   Menopausal state 10/16/2013   Rectocele    Seasonal allergies    Reviewed problem list, medications and allergies. Physical Assessment:   Vitals:   07/14/23 1538 07/14/23 1605  BP: (!) 143/93 (!) 146/87  Pulse: 76 68  Weight: 152 lb 6.4 oz (69.1 kg)   Height: 5\' 2"  (1.575 m)   Body mass index is 27.87 kg/m.       Physical Examination:   General appearance: alert, well appearing, and in no distress  Psych: mood appropriate, normal affect  Skin: warm & dry   Cardiovascular: normal heart rate noted  Respiratory: normal respiratory effort, no distress  Abdomen: soft, non-tender   Pelvic: VULVA: normal appearing vulva with no masses, tenderness or  lesions, VAGINA: normal appearing vagina with normal color and discharge, no lesions, redundant vaginal tissue noted.  Stage I uterine prolapse with cystocele.  Easily reducible CERVIX: normal appearing cervix without discharge or lesions, UTERUS: uterus is normal size, shape, consistency and nontender, ADNEXA: normal adnexa in size, nontender and no masses  Extremities: no edema   Chaperone: Lorean Rodes    Assessment & Plan:  1) uterine prolapse with cystocele stage I - Reviewed findings on exam and discussed basic components of prolapse - Patient is most interested in treating this with self-management.  Discussed self pelvic floor exercises and ways to help improve her pelvic floor muscles - Patient to follow-up if worsening of symptoms or desires to consider neck step including pessary or pelvic floor therapy  2) Preventive screening - Mammogram up-to-date - Pap collected, reviewed ASCCP guidelines  No orders of the defined types were placed in this encounter.   Return in about 1 year (around 07/13/2024), or if symptoms worsen or fail to improve, for Annual.   Genoa Freyre, DO Attending Obstetrician & Gynecologist, [facility], [facility]

## 2023-07-19 ENCOUNTER — Encounter: Payer: Self-pay | Admitting: Obstetrics & Gynecology

## 2023-07-19 LAB — CYTOLOGY - PAP
Comment: NEGATIVE
Diagnosis: NEGATIVE
High risk HPV: NEGATIVE

## 2023-10-18 ENCOUNTER — Encounter (INDEPENDENT_AMBULATORY_CARE_PROVIDER_SITE_OTHER): Payer: Self-pay | Admitting: *Deleted
